# Patient Record
Sex: Male | Born: 1945 | Race: White | Hispanic: No | Marital: Married | Smoking: Never smoker
Health system: Southern US, Community
[De-identification: ages and names within clinical notes are randomized; demographics above are authoritative.]

## PROBLEM LIST (undated history)

## (undated) DIAGNOSIS — C4491 Basal cell carcinoma of skin, unspecified: Secondary | ICD-10-CM

## (undated) DIAGNOSIS — M1990 Unspecified osteoarthritis, unspecified site: Secondary | ICD-10-CM

## (undated) DIAGNOSIS — C4490 Unspecified malignant neoplasm of skin, unspecified: Secondary | ICD-10-CM

## (undated) DIAGNOSIS — M48062 Spinal stenosis, lumbar region with neurogenic claudication: Secondary | ICD-10-CM

## (undated) DIAGNOSIS — E785 Hyperlipidemia, unspecified: Secondary | ICD-10-CM

## (undated) DIAGNOSIS — I10 Essential (primary) hypertension: Secondary | ICD-10-CM

## (undated) HISTORY — PX: CERVICAL SPINE SURGERY: SHX589

## (undated) HISTORY — PX: ANKLE FRACTURE SURGERY: SHX122

## (undated) HISTORY — PX: EYE SURGERY: SHX253

---

## 2015-08-05 ENCOUNTER — Other Ambulatory Visit: Payer: Self-pay | Admitting: Family Medicine

## 2015-08-05 DIAGNOSIS — M5416 Radiculopathy, lumbar region: Secondary | ICD-10-CM

## 2015-08-16 ENCOUNTER — Ambulatory Visit
Admission: RE | Admit: 2015-08-16 | Discharge: 2015-08-16 | Disposition: A | Discharge status: Home / Self Care | Payer: Medicare Other | Source: Ambulatory Visit | Attending: Family Medicine | Admitting: Family Medicine

## 2015-08-16 DIAGNOSIS — M5416 Radiculopathy, lumbar region: Secondary | ICD-10-CM

## 2015-08-16 DIAGNOSIS — M4806 Spinal stenosis, lumbar region: Secondary | ICD-10-CM | POA: Diagnosis not present

## 2015-11-05 ENCOUNTER — Encounter: Payer: Self-pay | Admitting: *Deleted

## 2015-11-08 ENCOUNTER — Ambulatory Visit: Payer: Medicare Other | Admitting: Anesthesiology

## 2015-11-08 ENCOUNTER — Encounter
Admission: RE | Disposition: A | Discharge status: Home / Self Care | Payer: Self-pay | Source: Ambulatory Visit | Attending: Gastroenterology

## 2015-11-08 ENCOUNTER — Encounter: Payer: Self-pay | Admitting: *Deleted

## 2015-11-08 ENCOUNTER — Ambulatory Visit
Admission: RE | Admit: 2015-11-08 | Discharge: 2015-11-08 | Disposition: A | Discharge status: Home / Self Care | Payer: Medicare Other | Source: Ambulatory Visit | Attending: Gastroenterology | Admitting: Gastroenterology

## 2015-11-08 DIAGNOSIS — K52832 Lymphocytic colitis: Secondary | ICD-10-CM | POA: Diagnosis not present

## 2015-11-08 DIAGNOSIS — Z791 Long term (current) use of non-steroidal anti-inflammatories (NSAID): Secondary | ICD-10-CM | POA: Diagnosis not present

## 2015-11-08 DIAGNOSIS — Z85828 Personal history of other malignant neoplasm of skin: Secondary | ICD-10-CM | POA: Insufficient documentation

## 2015-11-08 DIAGNOSIS — E669 Obesity, unspecified: Secondary | ICD-10-CM | POA: Insufficient documentation

## 2015-11-08 DIAGNOSIS — K5730 Diverticulosis of large intestine without perforation or abscess without bleeding: Secondary | ICD-10-CM | POA: Diagnosis not present

## 2015-11-08 DIAGNOSIS — I10 Essential (primary) hypertension: Secondary | ICD-10-CM | POA: Insufficient documentation

## 2015-11-08 DIAGNOSIS — Z7982 Long term (current) use of aspirin: Secondary | ICD-10-CM | POA: Diagnosis not present

## 2015-11-08 DIAGNOSIS — K621 Rectal polyp: Secondary | ICD-10-CM | POA: Insufficient documentation

## 2015-11-08 DIAGNOSIS — Z6830 Body mass index (BMI) 30.0-30.9, adult: Secondary | ICD-10-CM | POA: Diagnosis not present

## 2015-11-08 DIAGNOSIS — R197 Diarrhea, unspecified: Secondary | ICD-10-CM | POA: Insufficient documentation

## 2015-11-08 DIAGNOSIS — E785 Hyperlipidemia, unspecified: Secondary | ICD-10-CM | POA: Diagnosis not present

## 2015-11-08 DIAGNOSIS — Z79899 Other long term (current) drug therapy: Secondary | ICD-10-CM | POA: Insufficient documentation

## 2015-11-08 HISTORY — PX: COLONOSCOPY WITH PROPOFOL: SHX5780

## 2015-11-08 HISTORY — DX: Hyperlipidemia, unspecified: E78.5

## 2015-11-08 HISTORY — DX: Essential (primary) hypertension: I10

## 2015-11-08 HISTORY — DX: Unspecified malignant neoplasm of skin, unspecified: C44.90

## 2015-11-08 HISTORY — DX: Basal cell carcinoma of skin, unspecified: C44.91

## 2015-11-08 SURGERY — COLONOSCOPY WITH PROPOFOL
Anesthesia: General

## 2015-11-08 MED ORDER — SODIUM CHLORIDE 0.9 % IV SOLN
INTRAVENOUS | Status: DC
  Administered 2015-11-08: 1000 mL via INTRAVENOUS

## 2015-11-08 MED ORDER — LIDOCAINE HCL (CARDIAC) 20 MG/ML IV SOLN
INTRAVENOUS | Status: DC | PRN
  Administered 2015-11-08: 80 mg via INTRAVENOUS

## 2015-11-08 MED ORDER — PROPOFOL 500 MG/50ML IV EMUL
INTRAVENOUS | Status: DC | PRN
  Administered 2015-11-08: 125 ug/kg/min via INTRAVENOUS

## 2015-11-08 MED ORDER — PROPOFOL 10 MG/ML IV BOLUS
INTRAVENOUS | Status: DC | PRN
  Administered 2015-11-08: 60 mg via INTRAVENOUS

## 2015-11-08 MED ORDER — SODIUM CHLORIDE 0.9 % IV SOLN: INTRAVENOUS | Status: DC

## 2015-11-08 NOTE — Transfer of Care (Signed)
Immediate Anesthesia Transfer of Care Note  Patient: Thomas Berger  Procedure(s) Performed: Procedure(s): COLONOSCOPY WITH PROPOFOL (N/A)  Patient Location: PACU  Anesthesia Type:General  Level of Consciousness: awake and responds to stimulation  Airway & Oxygen Therapy: Patient Spontanous Breathing and Patient connected to nasal cannula oxygen  Post-op Assessment: Report given to RN and Post -op Vital signs reviewed and stable  Post vital signs: Reviewed and stable  Last Vitals:  Vitals:   11/08/15 1216 11/08/15 1331  BP: (!) 142/89 115/74  Pulse: 67 (!) 58  Resp: 18 12  Temp: (!) 36.1 C     Last Pain:  Vitals:   11/08/15 1216  TempSrc: Tympanic         Complications: No apparent anesthesia complications

## 2015-11-08 NOTE — Anesthesia Preprocedure Evaluation (Signed)
Anesthesia Evaluation  Patient identified by MRN, date of birth, ID band Patient awake    Reviewed: Allergy & Precautions, NPO status , Patient's Chart, lab work & pertinent test results  History of Anesthesia Complications Negative for: history of anesthetic complications  Airway Mallampati: II  TM Distance: >3 FB Neck ROM: Full    Dental  (+) Implants   Pulmonary neg pulmonary ROS, neg sleep apnea, neg COPD,    breath sounds clear to auscultation- rhonchi (-) wheezing      Cardiovascular Exercise Tolerance: Good hypertension, Pt. on medications (-) CAD and (-) Past MI  Rhythm:Regular Rate:Normal - Systolic murmurs and - Diastolic murmurs    Neuro/Psych negative neurological ROS  negative psych ROS   GI/Hepatic negative GI ROS, Neg liver ROS,   Endo/Other  negative endocrine ROSneg diabetes  Renal/GU negative Renal ROS     Musculoskeletal negative musculoskeletal ROS (+)   Abdominal (+) + obese,   Peds  Hematology negative hematology ROS (+)   Anesthesia Other Findings Past Medical History: No date: Basal cell carcinoma     Comment: nose, temple, lt.arm No date: HTN (hypertension) No date: Hyperlipidemia No date: Skin cancer   Reproductive/Obstetrics                             Anesthesia Physical Anesthesia Plan  ASA: II  Anesthesia Plan: General   Post-op Pain Management:    Induction: Intravenous  Airway Management Planned: Natural Airway  Additional Equipment:   Intra-op Plan:   Post-operative Plan:   Informed Consent: I have reviewed the patients History and Physical, chart, labs and discussed the procedure including the risks, benefits and alternatives for the proposed anesthesia with the patient or authorized representative who has indicated his/her understanding and acceptance.   Dental advisory given  Plan Discussed with: CRNA and  Anesthesiologist  Anesthesia Plan Comments:         Anesthesia Quick Evaluation

## 2015-11-08 NOTE — Anesthesia Postprocedure Evaluation (Signed)
Anesthesia Post Note  Patient: Thomas Berger  Procedure(s) Performed: Procedure(s) (LRB): COLONOSCOPY WITH PROPOFOL (N/A)  Patient location during evaluation: Endoscopy Anesthesia Type: General Level of consciousness: awake and alert and oriented Pain management: pain level controlled Vital Signs Assessment: post-procedure vital signs reviewed and stable Respiratory status: spontaneous breathing, nonlabored ventilation and respiratory function stable Cardiovascular status: blood pressure returned to baseline and stable Postop Assessment: no signs of nausea or vomiting Anesthetic complications: no    Last Vitals:  Vitals:   11/08/15 1351 11/08/15 1401  BP: 124/72 131/82  Pulse: 60 65  Resp: 14 11  Temp:      Last Pain:  Vitals:   11/08/15 1331  TempSrc: Tympanic                 [name]

## 2015-11-08 NOTE — Op Note (Signed)
[facility] Gastroenterology Patient Name: Thomas Berger Procedure Date: 11/08/2015 12:57 PM MRN: [id number] Account #: [id number] Date of Birth: February 24, 1945 Admit Type: Outpatient Age: 70 Room: [facility] ENDO ROOM 1 Gender: Male Note Status: Finalized Procedure:            Colonoscopy Indications:          Clinically significant diarrhea of unexplained origin Providers:            Lollie Sails, MD Referring MD:         Dion Body (Referring MD) Medicines:            Monitored Anesthesia Care Complications:        No immediate complications. Procedure:            Pre-Anesthesia Assessment:                       - ASA Grade Assessment: II - A patient with mild                        systemic disease.                       After obtaining informed consent, the colonoscope was                        passed under direct vision. Throughout the procedure,                        the patient's blood pressure, pulse, and oxygen                        saturations were monitored continuously. The                        Colonoscope was introduced through the anus and                        advanced to the the cecum, identified by appendiceal                        orifice and ileocecal valve. The colonoscopy was                        performed without difficulty. The patient tolerated the                        procedure well. The quality of the bowel preparation                        was good. Findings:      A 1 mm polyp was found in the rectum. The polyp was sessile. The polyp       was removed with a cold biopsy forceps. Resection and retrieval were       complete.      Multiple small to medium diverticula were found in the sigmoid colon,       descending colon, transverse colon and ascending colon.      Biopsies for histology were taken with a cold forceps from the right       colon and left colon for evaluation of microscopic colitis.      The  retroflexed  view of the distal rectum and anal verge was normal and       showed no anal or rectal abnormalities.      The digital rectal exam was normal. Impression:           - One 1 mm polyp in the rectum, removed with a cold                        biopsy forceps. Resected and retrieved.                       - Diverticulosis in the sigmoid colon, in the                        descending colon, in the transverse colon and in the                        ascending colon.                       - The distal rectum and anal verge are normal on                        retroflexion view.                       - Biopsies were taken with a cold forceps from the                        right colon and left colon for evaluation of                        microscopic colitis. Recommendation:       - Discharge patient to home.                       - Await pathology results.                       - Check serum vasoactive intestinal polypeptide and                        chromogranin A today.                       - Return to GI clinic in 3 weeks. Procedure Code(s):    --- Professional ---                       [phone], Colonoscopy, flexible; with biopsy, single or                        multiple Diagnosis Code(s):    --- Professional ---                       K62.1, Rectal polyp                       R19.7, Diarrhea, unspecified                       K57.30, Diverticulosis of large intestine without  perforation or abscess without bleeding CPT copyright 2016 [employer]. All rights reserved. The codes documented in this report are preliminary and upon coder review may  be revised to meet current compliance requirements. Lollie Sails, MD 11/08/2015 1:31:15 PM This report has been signed electronically. Number of Addenda: 0 Note Initiated On: 11/08/2015 12:57 PM Scope Withdrawal Time: 0 hours 10 minutes 5 seconds  Total Procedure Duration: 0 hours 19 minutes 39 seconds        [facility]

## 2015-11-08 NOTE — H&P (Signed)
Outpatient short stay form Pre-procedure 11/08/2015 12:56 PM Lollie Sails MD  Primary Physician: Dr Mariana Arn  Reason for visit:  Colonoscopy  History of present illness:  Patient is a 70 year old male presenting today as above. He has a history of episode of diarrhea that lasted for at least a month or so of uncertain etiology. Stool studies were negative. This seems to improve much although he still has an episode of diarrhea prep was on. He tolerated his prep well, he does take 81 mg aspirin which he held today. He takes no other aspirin products or blood thinning agents. He does take naproxen fairly regularly.   Current Facility-Administered Medications:  .  0.9 %  sodium chloride infusion, , Intravenous, Continuous, Lollie Sails, MD, Last Rate: 20 mL/hr at 11/08/15 1245, 1,000 mL at 11/08/15 1245 .  0.9 %  sodium chloride infusion, , Intravenous, Continuous, Lollie Sails, MD  Prescriptions Prior to Admission  Medication Sig Dispense Refill Last Dose  . aspirin 81 MG chewable tablet Chew 81 mg by mouth daily.   11/07/2015 at Unknown time  . cholecalciferol (VITAMIN D) 400 units TABS tablet Take 2,000 Units by mouth.   11/07/2015 at Unknown time  . diphenhydrAMINE (BENADRYL) 25 MG tablet Take 25 mg by mouth AC breakfast.   11/07/2015 at Unknown time  . losartan (COZAAR) 100 MG tablet Take 100 mg by mouth daily.   11/08/2015 at 0600  . naproxen sodium (ANAPROX) 220 MG tablet Take 220 mg by mouth every morning.     . simvastatin (ZOCOR) 40 MG tablet Take 40 mg by mouth daily.   11/07/2015 at Unknown time     Allergies  Allergen Reactions  . Penicillins      Past Medical History:  Diagnosis Date  . Basal cell carcinoma    nose, temple, lt.arm  . HTN (hypertension)   . Hyperlipidemia   . Skin cancer     Review of systems:      Physical Exam    Heart and lungs: Regular rate and rhythm without rub or gallop, lungs are bilaterally clear.    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    Planned proceedures: Colonoscopy and indicated procedures. I have discussed the risks benefits and complications of procedures to include not limited to bleeding, infection, perforation and the risk of sedation and the patient wishes to proceed.    Lollie Sails, MD Gastroenterology 11/08/2015  12:56 PM

## 2015-11-09 ENCOUNTER — Encounter: Payer: Self-pay | Admitting: Gastroenterology

## 2015-11-10 LAB — SURGICAL PATHOLOGY

## 2015-11-10 LAB — CHROMOGRANIN A: Chromogranin A: <1 nmol/L (ref 0–5)

## 2015-11-12 LAB — VASOACTIVE INTESTINAL PEPTIDE (VIP): Vasoactive Intest Polypeptide: <16.8 pg/mL (ref 0.0–58.8)

## 2017-05-02 ENCOUNTER — Other Ambulatory Visit: Payer: Self-pay | Admitting: Neurosurgery

## 2017-05-19 NOTE — H&P (Signed)
Patient ID:   [id number] Patient: Thomas Berger  Date of Birth: 09-24-1945 Visit Type: Office Visit   Date: 05/02/2017 11:30 AM Provider: Marchia Meiers. Vertell Limber MD   This 72 year old male presents for MRI review.  HISTORY OF PRESENT ILLNESS:  1.  MRI review  Patient returns to review his MRI.  He notes increased pain recently with extension and standing long periods.  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 L2-3 L3-4 L4-5 spinal stenosis most severe at L3-4. L4-5 level stenosis has worsened.  Pain level is 4/10 on average 10/10 with extension.  Advised to have XLIF of L2-3 L3-4 L4-5 XLIF with percutaneous screws.  On examination today, the patient continues to demonstrate full strength in his lower extremities and he has relief of his pain with sitting.  However on standing and particularly with extension, the patient complains of severe and disabling and incapacitating pain in his back and into both of his legs.  This is limiting his ability to stand for any length of time and his ability to walk any distance at all.         Medical/Surgical/Interim History Reviewed, no change.  Last detailed document date:11/08/2016.     PAST MEDICAL HISTORY, SURGICAL HISTORY, FAMILY HISTORY, SOCIAL HISTORY AND REVIEW OF SYSTEMS I have reviewed the patient's past medical, surgical, family and social history as well as the comprehensive review of systems as included on the [facility] history form dated 12/11/2016, which I have signed.  Family History:  Reviewed, no changes.  Last detailed document date:11/08/2016.   Social History: Reviewed, no changes. Last detailed document date: 11/08/2016.    MEDICATIONS: (added, continued or stopped this visit) Started Medication Directions Instruction Stopped   aspirin 81 mg chewable tablet chew 1 tablet by oral route  every day     Benadryl 25 mg capsule take 2 capsule  by oral route  every 4 - 6 hours as needed     losartan 100 mg tablet take 1 tablet by oral route  every day     melatonin 10 mg tablet Take as directed     omega-3 28.5 mg-dha 23.75 mg-epa 4.75 mg-fish oil 113.5 mg chew tablet      simvastatin 40 mg tablet take 1 tablet by oral route  every day in the evening     Vitamin D3 2,000 unit capsule Take daily as directed       ALLERGIES: Ingredient Reaction Medication Name Comment  PENICILLINS Hives     Reviewed, no changes.    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 L2-3 L3-4 L4-5 spinal stenosis most severe at L3-4. L4-5 level stenosis has worsened.  Pain level is 4/10 on average 10/10 with extension.  [name] pain is positional.  This is related to his mobile spondylolisthesis.  He has severe pain with standing and with extension and this is limiting his ability to walk any distance at all.  When he is seated he has normal strength in his legs.  He denies weakness.  He has weakness with standing and worsened with extension of his lumbar spine.  This is consistent with his symptomatic neurogenic claudication related to mobile spondylolisthesis with spinal stenosis affecting the  L2-3, L3-4, L4-5 levels.  PLAN:  Scheduled XLIF of L2-3 L3-4 L4-5 XLIF with percutaneous screws. Nurse education given.  Fitted for LSO brace  Orders: Diagnostic Procedures: Assessment Procedure  M54.16 Lumbar Spine- AP/Lat  Instruction(s)/Education: Assessment Instruction   Tobacco cessation counseling  I10 Hypertension education  Z68.31 Dietary management education, guidance, and counseling  Miscellaneous: Assessment   M48.062 Aspen Lo Sag Rigid Panel Quick   Completed Orders (this encounter) Order Details Reason Side Interpretation Result Initial  Treatment Date Region  Tobacco cessation counseling         Hypertension education Patient to follow up with primary care provider.        Dietary management education, guidance, and counseling patient encouraged to eat a well balanced diet         Assessment/Plan   # Detail Type Description   1. Assessment Spondylolisthesis, lumbar region (M43.16).       2. 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   Plan Orders Aspen Lo Sag Rigid Panel Quick.       3. Assessment Lumbar radiculopathy (M54.16).       4. 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       5. Assessment Essential (primary) hypertension (I10).       6. Assessment Body mass index (BMI) 31.0-31.9, adult (Z68.31).   Plan Orders Today's instructions / counseling include(s) Dietary management education, guidance, and counseling.         Pain Management Plan Pain Scale: 2/10. Method: Numeric Pain Intensity Scale. Location: back. Onset: 08/30/2013. Duration: varies. Quality: discomforting. Pain management follow-up plan of care: Patient is taking OTC pain relievers for relief..              Provider:  Vertell Limber MD, Marchia Meiers 05/16/2017 11:29 AM  Dictation edited by: Marchia Meiers. Vertell Limber    CC Providers: [facility] [address],  [place]  [address]-   Kanhka Linthavong  [facility] Internal Medicine Dept [place],  [address]-              Electronically signed by Marchia Meiers. Vertell Limber MD on 05/16/2017 11:29 AM  Patient: Thomas Berger  Date of Birth: 03-17-1945 Visit Type: Office Visit   Date: 03/26/2017 03:15 PM Provider: Marchia Meiers. Vertell Limber MD   This 72 year old male presents for numbness.   History of Present Illness: 1.  numbness  Patient returns noting increased numbness in both legs since walking his dog more frequently.  He notes standing long periods results in  difficulty walking.  Patient describes that his pain is not severe and ranges from 1 to 3/10 but that his numbness is much worse.  He feels that he is stumbling although not falling and he is having a lot more difficulty with leg numbness.  He gets numb in both of his legs when he is up for more than 3-4 minutes at a time any is only able to walk about 52 yd at a time before he has to sit down.   His history is consistent with worsening neurogenic claudication.  The I am concerned that he is developing more severe spinal stenosis.          PAST MEDICAL HISTORY, SURGICAL HISTORY, FAMILY HISTORY, SOCIAL HISTORY AND REVIEW OF SYSTEMS I have reviewed the patient's past medical, surgical, family and social history as well as the comprehensive review of systems as included on the [facility] history form dated 12/11/2016, which I have signed.   MEDICATIONS(added, continued or stopped this visit): Started Medication Directions Instruction Stopped   aspirin 81 mg chewable tablet chew 1 tablet by oral route  every day     Benadryl 25 mg capsule take 2 capsule by oral route  every 4 - 6 hours as needed     losartan 100 mg tablet take 1 tablet by oral route  every day     melatonin 10 mg tablet Take as directed     omega-3 28.5 mg-dha 23.75 mg-epa 4.75 mg-fish oil 113.5 mg chew tablet      simvastatin 40 mg tablet take 1 tablet by oral route  every day in the evening     Vitamin D3 2,000 unit capsule Take daily as directed       ALLERGIES: Ingredient Reaction Medication Name Comment  PENICILLINS Hives     Reviewed, no changes.    Vitals Date Temp F BP Pulse Ht In Wt Lb BMI BSA Pain Score  03/26/2017  171/91 77 68 206.6 31.41  3/10      IMPRESSION The patient appears to be developing worsening lumbar spinal stenosis.  I have recommended obtaining lumbar radiographs with flexion-extension views to assess for mobile spondylolisthesis and also a new MRI of  the lumbar spine.   Completed Orders (this encounter) Order Details Reason Side Interpretation Result Initial Treatment Date Region  Dietary management education, guidance, and counseling Encouraged to eat well balanced diet and follow up with primary care physician        Hypertension education Follow up with primary care physician for elevated Blood pressure        Lumbar Spine- AP/Lat/Flex/Ex      03/26/2017    Assessment/Plan # Detail Type Description   1. Assessment Lumbar radiculopathy (M54.16).       2. Assessment Low back pain, unspecified back pain laterality, with sciatica presence unspecified (M54.5).       3. Assessment Spondylolisthesis, lumbar region (M43.16).       4. 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       5. 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       6. Assessment Body mass index (BMI) 31.0-31.9, adult (Z68.31).   Plan Orders Today's instructions / counseling include(s) Dietary management education, guidance, and counseling.       7. Assessment Essential (primary) hypertension (I10).           Pain Management Plan Pain Scale: 3/10. Method: Numeric Pain Intensity Scale. Location: leg. Onset: 08/30/2013. Duration: varies. Quality: discomforting. Pain management follow-up plan of care: Patient alternating heat and ice.  Patient will return to see me after lumbar imaging has been performed.   Orders: Diagnostic Procedures: Assessment Procedure  M41.26 Lumbar Spine- AP/Lat/Flex/Ex  M43.16 Lumbar Spine- AP/Lat/Flex/Ex  M43.16 MRI Spine/lumb W/o Contrast  Instruction(s)/Education: Assessment Instruction  I10 Hypertension education  Z68.31 Dietary management education, guidance, and counseling             Provider:  Vertell Limber MDMarchia Meiers 04/01/2017 5:42 PM  Dictation edited by: Marchia Meiers. Vertell Limber    CC Providers: [facility] [address]  [place],  [place]  [address]-   Kanhka Linthavong  [facility] Internal Medicine Dept [place], [place] [address]-              Electronically signed by Marchia Meiers. Vertell Limber MD on  04/01/2017 06:24 PM  Patient ID:   [id number] Patient: Thomas Berger  Date of Birth: 1945/04/25 Visit Type: Office Visit   Date: 02/07/2017 11:45 AM Provider: Marchia Meiers. Vertell Limber MD   This 71 year old male presents for back pain.   History of Present Illness: 1.  back pain  Patient reports almost complete relief and the majority of his leg numbness resolved following his L3-4 ESI. He is concerned the leg numbness began to return several days ago. He reports altering his diet to help facilitate weight loss. He is also increasing his walking.           MEDICATIONS(added, continued or stopped this visit): Started Medication Directions Instruction Stopped   Aleve 220 mg tablet take 1 tablet by oral route  every 12 hours as needed  02/07/2017   aspirin 81 mg chewable tablet chew 1 tablet by oral route  every day     Benadryl 25 mg capsule take 2 capsule by oral route  every 4 - 6 hours as needed     losartan 100 mg tablet take 1 tablet by oral route  every day     melatonin 10 mg tablet Take as directed     omega-3 28.5 mg-dha 23.75 mg-epa 4.75 mg-fish oil 113.5 mg chew tablet      simvastatin 40 mg tablet take 1 tablet by oral route  every day in the evening     Vitamin D3 2,000 unit capsule Take daily as directed       ALLERGIES: Ingredient Reaction Medication Name Comment  PENICILLINS Hives        Vitals Date Temp F BP Pulse Ht In Wt Lb BMI BSA Pain Score  02/07/2017  159/93 65 68 206.6 31.41  1/10      IMPRESSION Patient returns as scheduled and pleased with the relief in bilateral leg numbness following L3-4 ESI. MRI shows severe listhesis of L3 on L4, and listhesis of L4 on L5 worse on the left. Advised patient to continue to follow-up with Dr. Maryjean Ka for  repeat injections. Patient will call if pain is no longer resolved with injections.      Pain Management Plan Pain Scale: 1/10. Method: Numeric Pain Intensity Scale. Location: back. Onset: 08/30/2013.  Follow-up prn.              Provider:  Vertell Limber MD, Marchia Meiers 02/07/2017 12:44 PM  Dictation edited by: Lucita Lora    CC Providers: [facility] [address] [place],  [place]  [address]-   Kanhka Linthavong  [facility] Internal Medicine Dept [place], [place] [address]-              Electronically signed by Marchia Meiers. Vertell Limber MD on 02/09/2017 04:47 PM  Patient ID:   [id number] Patient: Thomas Berger  Date of Birth: 05-02-1945 Visit Type: Office Visit   Date: 09/27/2015 09:15 AM Provider: Marchia Meiers. Vertell Limber MD   This 72 year old male presents for numbness.  History of Present Illness: 1.  numbness    Thomas Berger, 72 y.o. retired male visits for evaluation of BLE pain & numbness. Symptoms increasing in frequency x22yr include lumbar soreness followed by BLE numbness/weakness after walking on concrete or push-mowing.    Hx: Basal cellx3; HTN SxHx: '94right ankle; ACDF 2level 2010; basal cell 2013  Advil PM nightly  (off daytime NSAIDS d/t GI issues)  X-rays on [facility] reveal multilevel degenerative changes and facet arthritis, mild  levoconvex scoliosis, and L3-4 anterolisthesis of 7 mm in neutral, 8 mm in flexion, and 8 mm in extension.   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 Spondylolisthesis at L3-4.        PAST MEDICAL/SURGICAL HISTORY   (Detailed)  Disease/disorder Onset Date Management Date Comments    ankle surgery      Surgery, cervical spine      Basal cell (nose and cheek)    Cancer, basal Cell      High cholesterol      Hypertension         PAST MEDICAL HISTORY, SURGICAL HISTORY, FAMILY HISTORY,  SOCIAL HISTORY AND REVIEW OF SYSTEMS  09/27/2015, which I have signed.  Family History  (Detailed) Relationship Family Member Name Deceased Age at Death Condition Onset Age Cause of Death  Father    Alzheimer's disease  N  Father    Arthritis  N  Mother    Arthritis  N  Mother    Hypertension  N    SOCIAL HISTORY  (Detailed) Tobacco use reviewed. Preferred language is Unknown.   Smoking status: Never smoker.  SMOKING STATUS Use Status Type Smoking Status Usage Per Day Years Used Total Pack Years  no/never  Never smoker       HOME ENVIRONMENT/SAFETY The patient has not fallen in the last year.        MEDICATIONS(added, continued or stopped this visit): Started Medication Directions Instruction Stopped   Aleve 220 mg tablet take 1 tablet by oral route  every 12 hours as needed     aspirin 81 mg chewable tablet chew 1 tablet by oral route  every day     Benadryl 25 mg capsule take 2 capsule by oral route  every 4 - 6 hours as needed     losartan 100 mg tablet take 1 tablet by oral route  every day     melatonin 10 mg tablet Take as directed     simvastatin 40 mg tablet take 1 tablet by oral route  every day in the evening     Vitamin D3 2,000 unit capsule Take daily as directed       ALLERGIES: Ingredient Reaction Medication Name Comment  PENICILLINS Hives     Reviewed, updated.    Vitals Date Temp F BP Pulse Ht In Wt Lb BMI BSA Pain Score  09/27/2015  162/88 69 68 206 31.32  4/10     PHYSICAL EXAM General Level of Distress: no acute distress Overall Appearance: normal    Cardiovascular Cardiac: regular rate and rhythm without murmur  Respiratory Lungs: clear to auscultation  Neurological Recent and Remote Memory: normal Attention Span and Concentration:   normal Language: normal Fund of Knowledge: normal  Right Left Sensation: normal normal Upper Extremity Coordination: normal normal  Lower Extremity  Coordination: normal normal  Musculoskeletal Gait and Station: normal  Right Left Upper Extremity Muscle Strength: normal normal Lower Extremity Muscle Strength: normal normal Upper Extremity Muscle Tone:  normal normal Lower Extremity Muscle Tone: normal normal  Motor Strength Upper and lower extremity motor strength was tested in the clinically pertinent muscles.     Deep Tendon Reflexes  Right Left Biceps: normal normal Triceps: normal normal Brachiloradialis: normal normal Patellar: decrease normal Achilles: normal normal  Sensory Sensation was tested at L1 to S1.   Cranial Nerves II. Optic Nerve/Visual Fields: normal III. Oculomotor: normal IV. Trochlear: normal V. Trigeminal: normal VI.  Abducens: normal VII. Facial: normal VIII. Acoustic/Vestibular: normal IX. Glossopharyngeal: normal X. Vagus: normal XI. Spinal Accessory: normal XII. Hypoglossal: normal  Motor and other Tests Lhermittes: negative Rhomberg: negative    Right Left Hoffman's: normal normal Clonus: normal normal Babinski: normal normal SLR: negative negative Patrick's Corky Sox): negative negative Toe Walk: normal normal Toe Lift: normal normal Heel Walk: normal normal SI Joint: nontender nontender   Additional Findings:  Toe touch to the floor, UE and LE strength is normal, symmetric reflexes   DIAGNOSTIC RESULTS X-rays on [facility] reveal multilevel degenerative changes and facet arthritis, mild levoconvex scoliosis, and L3-4 anterolisthesis of 7 mm in neutral, 8 mm in flexion, and 8 mm in extension.   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 Spondylolisthesis at L3-4.    IMPRESSION The patient is experiencing low back and left>right leg pain/numbness. On review of his imaging, he has multilevel degenerative changes, facet arthritis, and spinal stenosis, which is severe at L3-4 and moderate to severe at L2-3 and L  45. He also has anterolisthesis at L3-4. On confrontational testing, he has negative SLR bilaterally, strength is normal, and a decreased patellar reflex on the right. Since the patient's pain level is not severe, I do not recommend surgical intervention at this time although his imaging is quite significant. I advise that he get into some PT and possible injections for conservative treatment of his low back and bilateral leg symptoms.  Completed Orders (this encounter) Order Details Reason Side Interpretation Result Initial Treatment Date Region  Dietary management education, guidance, and counseling Encouraged to eat a well balanced diet and follow up with primary care physician.        Hypertension education Follow up with primary care physician for elevated blood pressure.        Lumbar Spine- AP/Lat/Obls/Spot/Flex/Ex      09/27/2015 All Levels to All Levels   Assessment/Plan # Detail Type Description   1. Assessment Body mass index (BMI) 31.0-31.9, adult (Z68.31).   Plan Orders Today's instructions / counseling include(s) Dietary management education, guidance, and counseling.       2. Assessment Essential (primary) hypertension (I10).       3. Assessment Lumbar radiculopathy (M54.16).         Pain Assessment/Treatment Pain Scale: 4/10. Method: Numeric Pain Intensity Scale. Location: leg. Onset: 08/31/2014. Duration: varies. Quality: discomforting. Pain Assessment/Treatment follow-up plan of care: Patient is taking over the counter pain relievers for relief..  Fall Risk Plan The patient has not fallen in the last year.  Ordered PT. Follow up in 2 months.   Orders: Diagnostic Procedures: Assessment Procedure  M54.16 Lumbar Spine- AP/Lat/Obls/Spot/Flex/Ex  Instruction(s)/Education: Assessment Instruction  I10 Hypertension education  Z68.31 Dietary management education, guidance, and counseling             Provider:  Marchia Meiers. Vertell Limber MD  09/27/2015 09:52 AM Dictation  edited by: Johnella Moloney    CC Providers: [facility] [address] [place] [address] [place],  [place]  [address]-   Kanhka Linthavong [facility] Internal Medicine Dept [place], [place] [address]-              Electronically signed by Marchia Meiers. Vertell Limber MD on 09/27/2015 10:45 AM

## 2017-05-29 NOTE — Pre-Procedure Instructions (Signed)
Thomas Berger  05/29/2017      [facility] [facility] [address] [place] [address] Phone: [phone] Fax: [phone]    Your procedure is scheduled on Fri., Jun 08, 2017  Report to [facility] Admitting Entrance "A" at 10:00AM  Call this number if you have problems the morning of surgery:  [phone]   Remember:  Do not eat food or drink liquids after midnight.  Take these medicines the morning of surgery with A SIP OF WATER: If needed Acetaminophen (TYLENOL)for pain and SALINE Nasal Rinse for congestion.  Follow your doctor's instruction regarding Aspirin  7 days before surgery (5/3), stop taking all Aspirins, Vitamins, Fish oils, and Herbal medications. Also stop all NSAIDS i.e. Advil, Ibuprofen, Motrin, Aleve, Anaprox, Naproxen, BC and Goody Powders. Including: BETA CAROTENE and Melatonin   Do not wear jewelry.  Do not wear lotions, powders, colognes, or deodorant.  Do not shave 48 hours prior to surgery.  Men may shave face.  Do not bring valuables to the hospital.  [facility] is not responsible for any belongings or valuables.  Contacts, dentures or bridgework may not be worn into surgery.  Leave your suitcase in the car.  After surgery it may be brought to your room.  For patients admitted to the hospital, discharge time will be determined by your treatment team.  Patients discharged the day of surgery will not be allowed to drive home.   Special instructions:   [facility]- Preparing For Surgery  Before surgery, you can play an important role. Because skin is not sterile, your skin needs to be as free of germs as possible. You can reduce the number of germs on your skin by washing with CHG (chlorahexidine gluconate) Soap before surgery.  CHG is an antiseptic cleaner which kills germs and bonds with the skin to continue killing germs even after washing.  Please do not  use if you have an allergy to CHG or antibacterial soaps. If your skin becomes reddened/irritated stop using the CHG.  Do not shave (including legs and underarms) for at least 48 hours prior to first CHG shower. It is OK to shave your face.  Please follow these instructions carefully.   1. Shower the NIGHT BEFORE SURGERY and the MORNING OF SURGERY with CHG.   2. If you chose to wash your hair, wash your hair first as usual with your normal shampoo.  3. After you shampoo, rinse your hair and body thoroughly to remove the shampoo.  4. Use CHG as you would any other liquid soap. You can apply CHG directly to the skin and wash gently with a scrungie or a clean washcloth.   5. Apply the CHG Soap to your body ONLY FROM THE NECK DOWN.  Do not use on open wounds or open sores. Avoid contact with your eyes, ears, mouth and genitals (private parts). Wash Face and genitals (private parts)  with your normal soap.  6. Wash thoroughly, paying special attention to the area where your surgery will be performed.  7. Thoroughly rinse your body with warm water from the neck down.  8. DO NOT shower/wash with your normal soap after using and rinsing off the CHG Soap.  9. Pat yourself dry with a CLEAN TOWEL.  10. Wear CLEAN PAJAMAS to bed the night before surgery, wear comfortable clothes the morning of surgery  11.  Place CLEAN SHEETS on your bed the night of your first shower and DO NOT SLEEP WITH PETS.  Day of Surgery: Do not apply any deodorants/lotions. Please wear clean clothes to the hospital/surgery center.    Please read over the following fact sheets that you were given. Pain Booklet, Coughing and Deep Breathing, MRSA Information and Surgical Site Infection Prevention

## 2017-05-30 ENCOUNTER — Encounter (HOSPITAL_COMMUNITY)
Admission: RE | Admit: 2017-05-30 | Discharge: 2017-05-30 | Disposition: A | Discharge status: Home / Self Care | Payer: Medicare Other | Source: Ambulatory Visit | Attending: Neurosurgery | Admitting: Neurosurgery

## 2017-05-30 ENCOUNTER — Encounter (HOSPITAL_COMMUNITY): Payer: Self-pay

## 2017-05-30 ENCOUNTER — Other Ambulatory Visit: Payer: Self-pay

## 2017-05-30 DIAGNOSIS — R001 Bradycardia, unspecified: Secondary | ICD-10-CM | POA: Diagnosis not present

## 2017-05-30 DIAGNOSIS — Z01810 Encounter for preprocedural cardiovascular examination: Secondary | ICD-10-CM | POA: Diagnosis present

## 2017-05-30 DIAGNOSIS — Z01812 Encounter for preprocedural laboratory examination: Secondary | ICD-10-CM | POA: Insufficient documentation

## 2017-05-30 HISTORY — DX: Spinal stenosis, lumbar region with neurogenic claudication: M48.062

## 2017-05-30 HISTORY — DX: Unspecified osteoarthritis, unspecified site: M19.90

## 2017-05-30 LAB — BASIC METABOLIC PANEL
Anion gap: 7 (ref 5–15)
BUN: 24 mg/dL — AB (ref 6–20)
CALCIUM: 9.4 mg/dL (ref 8.9–10.3)
CO2: 27 mmol/L (ref 22–32)
CREATININE: 1.17 mg/dL (ref 0.61–1.24)
Chloride: 105 mmol/L (ref 101–111)
GFR calc Af Amer: >60 mL/min (ref >60)
GLUCOSE: 109 mg/dL — AB (ref 65–99)
Potassium: 4.3 mmol/L (ref 3.5–5.1)
Sodium: 139 mmol/L (ref 135–145)

## 2017-05-30 LAB — ABO/RH: ABO/RH(D): A POS

## 2017-05-30 LAB — CBC
HCT: 40.9 % (ref 39.0–52.0)
Hemoglobin: 13.9 g/dL (ref 13.0–17.0)
MCH: 30.3 pg (ref 26.0–34.0)
MCHC: 34 g/dL (ref 30.0–36.0)
MCV: 89.3 fL (ref 78.0–100.0)
PLATELETS: 208 10*3/uL (ref 150–400)
RBC: 4.58 MIL/uL (ref 4.22–5.81)
RDW: 12 % (ref 11.5–15.5)
WBC: 5.1 10*3/uL (ref 4.0–10.5)

## 2017-05-30 LAB — TYPE AND SCREEN
ABO/RH(D): A POS
ANTIBODY SCREEN: NEGATIVE

## 2017-05-30 LAB — SURGICAL PCR SCREEN
MRSA, PCR: NEGATIVE
STAPHYLOCOCCUS AUREUS: NEGATIVE

## 2017-05-30 NOTE — Progress Notes (Signed)
[facility]  Cardiologist - Denies  Chest x-ray - Denies  EKG - 05/30/17  Stress Test - Denies  ECHO - Denies  Cardiac Cath - Denies  Sleep Study - Denies CPAP - None  LABS- 05/30/17: CBC, BMP, T/S  ASA- LD- 5/3  Anesthesia- No  Pt denies having chest pain, sob, or fever at this time. All instructions explained to the pt, with a verbal understanding of the material. Pt agrees to go over the instructions while at home for a better understanding. The opportunity to ask questions was provided.

## 2017-06-08 ENCOUNTER — Encounter (HOSPITAL_COMMUNITY): Payer: Self-pay | Admitting: *Deleted

## 2017-06-08 ENCOUNTER — Inpatient Hospital Stay (HOSPITAL_COMMUNITY): Payer: Medicare Other | Admitting: Certified Registered"

## 2017-06-08 ENCOUNTER — Encounter (HOSPITAL_COMMUNITY)
Admission: RE | Disposition: A | Discharge status: Home / Self Care | Payer: Self-pay | Source: Ambulatory Visit | Attending: Neurosurgery

## 2017-06-08 ENCOUNTER — Inpatient Hospital Stay (HOSPITAL_COMMUNITY): Payer: Medicare Other

## 2017-06-08 ENCOUNTER — Inpatient Hospital Stay (HOSPITAL_COMMUNITY)
Admission: RE | Admit: 2017-06-08 | Discharge: 2017-06-11 | DRG: 460 | Disposition: A | Discharge status: Home / Self Care | Payer: Medicare Other | Source: Ambulatory Visit | Attending: Neurosurgery | Admitting: Neurosurgery

## 2017-06-08 ENCOUNTER — Other Ambulatory Visit: Payer: Self-pay

## 2017-06-08 DIAGNOSIS — M4306 Spondylolysis, lumbar region: Secondary | ICD-10-CM

## 2017-06-08 DIAGNOSIS — M4696 Unspecified inflammatory spondylopathy, lumbar region: Secondary | ICD-10-CM | POA: Diagnosis present

## 2017-06-08 DIAGNOSIS — Z8261 Family history of arthritis: Secondary | ICD-10-CM

## 2017-06-08 DIAGNOSIS — I10 Essential (primary) hypertension: Secondary | ICD-10-CM | POA: Diagnosis present

## 2017-06-08 DIAGNOSIS — M5416 Radiculopathy, lumbar region: Secondary | ICD-10-CM | POA: Diagnosis present

## 2017-06-08 DIAGNOSIS — Z8249 Family history of ischemic heart disease and other diseases of the circulatory system: Secondary | ICD-10-CM | POA: Diagnosis not present

## 2017-06-08 DIAGNOSIS — M4126 Other idiopathic scoliosis, lumbar region: Secondary | ICD-10-CM | POA: Diagnosis present

## 2017-06-08 DIAGNOSIS — Z7982 Long term (current) use of aspirin: Secondary | ICD-10-CM

## 2017-06-08 DIAGNOSIS — M48062 Spinal stenosis, lumbar region with neurogenic claudication: Principal | ICD-10-CM | POA: Diagnosis present

## 2017-06-08 DIAGNOSIS — Z880 Allergy status to penicillin: Secondary | ICD-10-CM

## 2017-06-08 DIAGNOSIS — R110 Nausea: Secondary | ICD-10-CM | POA: Diagnosis not present

## 2017-06-08 DIAGNOSIS — E7800 Pure hypercholesterolemia, unspecified: Secondary | ICD-10-CM | POA: Diagnosis present

## 2017-06-08 DIAGNOSIS — M4316 Spondylolisthesis, lumbar region: Secondary | ICD-10-CM | POA: Diagnosis present

## 2017-06-08 HISTORY — PX: ANTERIOR LAT LUMBAR FUSION: SHX1168

## 2017-06-08 HISTORY — PX: LUMBAR PERCUTANEOUS PEDICLE SCREW 3 LEVEL: SHX5562

## 2017-06-08 SURGERY — ANTERIOR LATERAL LUMBAR FUSION 3 LEVELS
Anesthesia: General | Laterality: Left

## 2017-06-08 MED ORDER — SODIUM CHLORIDE 0.9% FLUSH
3.0000 mL | Freq: Two times a day (BID) | INTRAVENOUS | Status: DC
  Administered 2017-06-09 – 2017-06-10 (×2): 3 mL via INTRAVENOUS

## 2017-06-08 MED ORDER — LIDOCAINE-EPINEPHRINE 1 %-1:100000 IJ SOLN
INTRAMUSCULAR | Status: AC
Start: 2017-06-08 — End: ?
  Filled 2017-06-08: qty 1

## 2017-06-08 MED ORDER — BUPIVACAINE HCL (PF) 0.5 % IJ SOLN
INTRAMUSCULAR | Status: AC
  Filled 2017-06-08: qty 30

## 2017-06-08 MED ORDER — LIDOCAINE-EPINEPHRINE 1 %-1:100000 IJ SOLN
INTRAMUSCULAR | Status: AC
  Filled 2017-06-08: qty 1

## 2017-06-08 MED ORDER — PHENYLEPHRINE 40 MCG/ML (10ML) SYRINGE FOR IV PUSH (FOR BLOOD PRESSURE SUPPORT)
PREFILLED_SYRINGE | INTRAVENOUS | Status: AC
  Filled 2017-06-08: qty 20

## 2017-06-08 MED ORDER — MENTHOL 3 MG MT LOZG
1.0000 | LOZENGE | OROMUCOSAL | Status: DC | PRN
  Administered 2017-06-09: 3 mg via ORAL
  Filled 2017-06-08: qty 9

## 2017-06-08 MED ORDER — HYDROMORPHONE HCL 2 MG/ML IJ SOLN
0.2500 mg | INTRAMUSCULAR | Status: DC | PRN
  Administered 2017-06-08 (×4): 0.5 mg via INTRAVENOUS

## 2017-06-08 MED ORDER — ALUM & MAG HYDROXIDE-SIMETH 200-200-20 MG/5ML PO SUSP
30.0000 mL | Freq: Four times a day (QID) | ORAL | Status: DC | PRN
  Administered 2017-06-09 – 2017-06-10 (×2): 30 mL via ORAL
  Filled 2017-06-08 (×2): qty 30

## 2017-06-08 MED ORDER — LACTATED RINGERS IV SOLN
INTRAVENOUS | Status: DC
  Administered 2017-06-08 (×3): via INTRAVENOUS

## 2017-06-08 MED ORDER — GLYCOPYRROLATE 0.2 MG/ML IJ SOLN
INTRAMUSCULAR | Status: DC | PRN
  Administered 2017-06-08: 0.3 mg via INTRAVENOUS

## 2017-06-08 MED ORDER — METHOCARBAMOL 500 MG PO TABS
ORAL_TABLET | ORAL | Status: AC
  Administered 2017-06-09: 500 mg via ORAL
  Filled 2017-06-08: qty 1

## 2017-06-08 MED ORDER — FENTANYL CITRATE (PF) 100 MCG/2ML IJ SOLN
INTRAMUSCULAR | Status: DC | PRN
  Administered 2017-06-08 (×3): 50 ug via INTRAVENOUS
  Administered 2017-06-08: 100 ug via INTRAVENOUS
  Administered 2017-06-08: 50 ug via INTRAVENOUS
  Administered 2017-06-08: 100 ug via INTRAVENOUS
  Administered 2017-06-08: 50 ug via INTRAVENOUS

## 2017-06-08 MED ORDER — EPHEDRINE SULFATE 50 MG/ML IJ SOLN
INTRAMUSCULAR | Status: DC | PRN
  Administered 2017-06-08: 10 mg via INTRAVENOUS

## 2017-06-08 MED ORDER — METHOCARBAMOL 1000 MG/10ML IJ SOLN
500.0000 mg | Freq: Four times a day (QID) | INTRAVENOUS | Status: DC | PRN
  Filled 2017-06-08: qty 5

## 2017-06-08 MED ORDER — MELATONIN 3 MG PO TABS
3.0000 mg | ORAL_TABLET | Freq: Every day | ORAL | Status: DC
  Administered 2017-06-08 – 2017-06-10 (×3): 3 mg via ORAL
  Filled 2017-06-08 (×3): qty 1

## 2017-06-08 MED ORDER — VANCOMYCIN HCL IN DEXTROSE 1-5 GM/200ML-% IV SOLN
1000.0000 mg | Freq: Once | INTRAVENOUS | Status: AC
  Administered 2017-06-08: 1000 mg via INTRAVENOUS
  Filled 2017-06-08: qty 200

## 2017-06-08 MED ORDER — SIMVASTATIN 20 MG PO TABS
20.0000 mg | ORAL_TABLET | Freq: Every day | ORAL | Status: DC
  Administered 2017-06-08 – 2017-06-10 (×3): 20 mg via ORAL
  Filled 2017-06-08 (×3): qty 1

## 2017-06-08 MED ORDER — HYDROCODONE-ACETAMINOPHEN 5-325 MG PO TABS
1.0000 | ORAL_TABLET | ORAL | Status: DC | PRN
  Administered 2017-06-09 (×2): 1 via ORAL
  Filled 2017-06-08 (×2): qty 1

## 2017-06-08 MED ORDER — METHOCARBAMOL 500 MG PO TABS
500.0000 mg | ORAL_TABLET | Freq: Four times a day (QID) | ORAL | Status: DC | PRN
  Administered 2017-06-08 – 2017-06-11 (×7): 500 mg via ORAL
  Filled 2017-06-08 (×7): qty 1

## 2017-06-08 MED ORDER — SUGAMMADEX SODIUM 200 MG/2ML IV SOLN
INTRAVENOUS | Status: AC
  Filled 2017-06-08: qty 2

## 2017-06-08 MED ORDER — OXYCODONE HCL 5 MG PO TABS
ORAL_TABLET | ORAL | Status: AC
  Administered 2017-06-08: 10 mg via ORAL
  Filled 2017-06-08: qty 2

## 2017-06-08 MED ORDER — ACETAMINOPHEN 325 MG PO TABS
650.0000 mg | ORAL_TABLET | ORAL | Status: DC | PRN
  Administered 2017-06-09 – 2017-06-10 (×2): 650 mg via ORAL
  Filled 2017-06-08 (×2): qty 2

## 2017-06-08 MED ORDER — SODIUM CHLORIDE 0.9% FLUSH: 3.0000 mL | INTRAVENOUS | Status: DC | PRN

## 2017-06-08 MED ORDER — PROPOFOL 10 MG/ML IV BOLUS
INTRAVENOUS | Status: DC | PRN
  Administered 2017-06-08: 150 mg via INTRAVENOUS

## 2017-06-08 MED ORDER — ZOLPIDEM TARTRATE 5 MG PO TABS: 5.0000 mg | ORAL_TABLET | Freq: Every evening | ORAL | Status: DC | PRN

## 2017-06-08 MED ORDER — VANCOMYCIN HCL IN DEXTROSE 1-5 GM/200ML-% IV SOLN
INTRAVENOUS | Status: AC
  Administered 2017-06-08: 1000 mg via INTRAVENOUS
  Filled 2017-06-08: qty 200

## 2017-06-08 MED ORDER — LIDOCAINE HCL (CARDIAC) PF 100 MG/5ML IV SOSY
PREFILLED_SYRINGE | INTRAVENOUS | Status: DC | PRN
  Administered 2017-06-08: 100 mg via INTRAVENOUS

## 2017-06-08 MED ORDER — LOSARTAN POTASSIUM 50 MG PO TABS
100.0000 mg | ORAL_TABLET | Freq: Every day | ORAL | Status: DC
  Administered 2017-06-08 – 2017-06-11 (×4): 100 mg via ORAL
  Filled 2017-06-08 (×4): qty 2

## 2017-06-08 MED ORDER — OXYCODONE HCL 5 MG PO TABS
10.0000 mg | ORAL_TABLET | ORAL | Status: DC | PRN
  Administered 2017-06-08 – 2017-06-11 (×8): 10 mg via ORAL
  Filled 2017-06-08 (×8): qty 2

## 2017-06-08 MED ORDER — ONDANSETRON HCL 4 MG/2ML IJ SOLN
INTRAMUSCULAR | Status: AC
  Filled 2017-06-08: qty 4

## 2017-06-08 MED ORDER — ONDANSETRON HCL 4 MG/2ML IJ SOLN
INTRAMUSCULAR | Status: DC | PRN
  Administered 2017-06-08: 4 mg via INTRAVENOUS

## 2017-06-08 MED ORDER — CHLORHEXIDINE GLUCONATE CLOTH 2 % EX PADS: 6.0000 | MEDICATED_PAD | Freq: Once | CUTANEOUS | Status: DC

## 2017-06-08 MED ORDER — ASPIRIN EC 81 MG PO TBEC
81.0000 mg | DELAYED_RELEASE_TABLET | Freq: Every day | ORAL | Status: DC
  Administered 2017-06-08 – 2017-06-11 (×4): 81 mg via ORAL
  Filled 2017-06-08 (×4): qty 1

## 2017-06-08 MED ORDER — DEXAMETHASONE SODIUM PHOSPHATE 10 MG/ML IJ SOLN
INTRAMUSCULAR | Status: AC
  Filled 2017-06-08: qty 2

## 2017-06-08 MED ORDER — ACETAMINOPHEN 500 MG PO TABS
500.0000 mg | ORAL_TABLET | Freq: Every day | ORAL | Status: DC | PRN
  Administered 2017-06-10 (×2): 500 mg via ORAL
  Filled 2017-06-08 (×2): qty 1

## 2017-06-08 MED ORDER — BUPIVACAINE HCL (PF) 0.5 % IJ SOLN
INTRAMUSCULAR | Status: DC | PRN
  Administered 2017-06-08: 10 mL
  Administered 2017-06-08: 9 mL

## 2017-06-08 MED ORDER — SUCCINYLCHOLINE CHLORIDE 20 MG/ML IJ SOLN
INTRAMUSCULAR | Status: DC | PRN
  Administered 2017-06-08: 120 mg via INTRAVENOUS

## 2017-06-08 MED ORDER — ONDANSETRON HCL 4 MG/2ML IJ SOLN
4.0000 mg | Freq: Four times a day (QID) | INTRAMUSCULAR | Status: DC | PRN
  Administered 2017-06-09: 4 mg via INTRAVENOUS
  Filled 2017-06-08: qty 2

## 2017-06-08 MED ORDER — SODIUM CHLORIDE 0.9 % IV SOLN: 250.0000 mL | INTRAVENOUS | Status: DC

## 2017-06-08 MED ORDER — ROCURONIUM BROMIDE 50 MG/5ML IV SOLN
INTRAVENOUS | Status: AC
  Filled 2017-06-08: qty 4

## 2017-06-08 MED ORDER — BISACODYL 10 MG RE SUPP
10.0000 mg | Freq: Every day | RECTAL | Status: DC | PRN
  Administered 2017-06-10: 10 mg via RECTAL
  Filled 2017-06-08: qty 1

## 2017-06-08 MED ORDER — PROPOFOL 500 MG/50ML IV EMUL
INTRAVENOUS | Status: DC | PRN
Start: 2017-06-08 — End: 2017-06-08
  Administered 2017-06-08: 75 ug/kg/min via INTRAVENOUS

## 2017-06-08 MED ORDER — VANCOMYCIN HCL IN DEXTROSE 1-5 GM/200ML-% IV SOLN
1000.0000 mg | INTRAVENOUS | Status: AC
  Administered 2017-06-08: 1000 mg via INTRAVENOUS

## 2017-06-08 MED ORDER — LIDOCAINE-EPINEPHRINE 1 %-1:100000 IJ SOLN
INTRAMUSCULAR | Status: DC | PRN
  Administered 2017-06-08: 9 mL
  Administered 2017-06-08: 20 mL

## 2017-06-08 MED ORDER — MORPHINE SULFATE (PF) 2 MG/ML IV SOLN
2.0000 mg | INTRAVENOUS | Status: DC | PRN
  Administered 2017-06-09: 2 mg via INTRAVENOUS
  Filled 2017-06-08: qty 1

## 2017-06-08 MED ORDER — FLEET ENEMA 7-19 GM/118ML RE ENEM: 1.0000 | ENEMA | Freq: Once | RECTAL | Status: DC | PRN

## 2017-06-08 MED ORDER — SALINE SPRAY 0.65 % NA SOLN
1.0000 | NASAL | Status: DC | PRN
  Filled 2017-06-08: qty 44

## 2017-06-08 MED ORDER — MIDAZOLAM HCL 5 MG/5ML IJ SOLN
INTRAMUSCULAR | Status: DC | PRN
  Administered 2017-06-08: 2 mg via INTRAVENOUS

## 2017-06-08 MED ORDER — DOCUSATE SODIUM 100 MG PO CAPS
100.0000 mg | ORAL_CAPSULE | Freq: Two times a day (BID) | ORAL | Status: DC
  Administered 2017-06-08 – 2017-06-11 (×6): 100 mg via ORAL
  Filled 2017-06-08 (×6): qty 1

## 2017-06-08 MED ORDER — FENTANYL CITRATE (PF) 250 MCG/5ML IJ SOLN
INTRAMUSCULAR | Status: AC
Start: 2017-06-08 — End: ?
  Filled 2017-06-08: qty 5

## 2017-06-08 MED ORDER — KCL IN DEXTROSE-NACL 20-5-0.45 MEQ/L-%-% IV SOLN
INTRAVENOUS | Status: DC
  Administered 2017-06-08: 1000 mL via INTRAVENOUS

## 2017-06-08 MED ORDER — MELATONIN 5 MG PO TABS
5.0000 mg | ORAL_TABLET | Freq: Every day | ORAL | Status: DC
  Filled 2017-06-08: qty 1

## 2017-06-08 MED ORDER — ONDANSETRON HCL 4 MG PO TABS: 4.0000 mg | ORAL_TABLET | Freq: Four times a day (QID) | ORAL | Status: DC | PRN

## 2017-06-08 MED ORDER — ACETAMINOPHEN 650 MG RE SUPP: 650.0000 mg | RECTAL | Status: DC | PRN

## 2017-06-08 MED ORDER — FAMOTIDINE IN NACL 20-0.9 MG/50ML-% IV SOLN
20.0000 mg | Freq: Two times a day (BID) | INTRAVENOUS | Status: DC
  Administered 2017-06-08 – 2017-06-09 (×2): 20 mg via INTRAVENOUS
  Filled 2017-06-08: qty 50

## 2017-06-08 MED ORDER — 0.9 % SODIUM CHLORIDE (POUR BTL) OPTIME
TOPICAL | Status: DC | PRN
  Administered 2017-06-08: 1000 mL

## 2017-06-08 MED ORDER — MIDAZOLAM HCL 2 MG/2ML IJ SOLN
INTRAMUSCULAR | Status: AC
  Filled 2017-06-08: qty 2

## 2017-06-08 MED ORDER — PROPOFOL 10 MG/ML IV BOLUS
INTRAVENOUS | Status: AC
  Filled 2017-06-08: qty 20

## 2017-06-08 MED ORDER — PHENOL 1.4 % MT LIQD: 1.0000 | OROMUCOSAL | Status: DC | PRN

## 2017-06-08 MED ORDER — POLYETHYLENE GLYCOL 3350 17 G PO PACK
17.0000 g | PACK | Freq: Every day | ORAL | Status: DC | PRN
  Administered 2017-06-09: 17 g via ORAL
  Filled 2017-06-08: qty 1

## 2017-06-08 MED ORDER — SODIUM CHLORIDE-SODIUM BICARB 1.57 G NA PACK: PACK | Freq: Every day | NASAL | Status: DC | PRN

## 2017-06-08 MED ORDER — DEXAMETHASONE SODIUM PHOSPHATE 4 MG/ML IJ SOLN
INTRAMUSCULAR | Status: DC | PRN
  Administered 2017-06-08: 10 mg via INTRAVENOUS

## 2017-06-08 MED ORDER — EPHEDRINE SULFATE 50 MG/ML IJ SOLN
INTRAMUSCULAR | Status: AC
  Filled 2017-06-08: qty 1

## 2017-06-08 MED ORDER — HYDROMORPHONE HCL 2 MG/ML IJ SOLN
INTRAMUSCULAR | Status: AC
  Administered 2017-06-08: 0.5 mg via INTRAVENOUS
  Filled 2017-06-08: qty 1

## 2017-06-08 MED ORDER — FENTANYL CITRATE (PF) 250 MCG/5ML IJ SOLN
INTRAMUSCULAR | Status: AC
  Filled 2017-06-08: qty 5

## 2017-06-08 MED ORDER — PHENYLEPHRINE HCL 10 MG/ML IJ SOLN
INTRAVENOUS | Status: DC | PRN
  Administered 2017-06-08: 40 ug/min via INTRAVENOUS

## 2017-06-08 MED ORDER — DIPHENHYDRAMINE HCL 25 MG PO CAPS
25.0000 mg | ORAL_CAPSULE | Freq: Every day | ORAL | Status: DC
  Administered 2017-06-08 – 2017-06-10 (×3): 25 mg via ORAL
  Filled 2017-06-08 (×4): qty 1

## 2017-06-08 MED ORDER — LIDOCAINE 2% (20 MG/ML) 5 ML SYRINGE
INTRAMUSCULAR | Status: AC
  Filled 2017-06-08: qty 10

## 2017-06-08 SURGICAL SUPPLY — 75 items
BLADE CLIPPER SURG (BLADE) ×3 IMPLANT
CARTRIDGE OIL MAESTRO DRILL (MISCELLANEOUS) IMPLANT
CLIP NEUROVISION LG (CLIP) ×3 IMPLANT
CONT SPEC 4OZ CLIKSEAL STRL BL (MISCELLANEOUS) ×3 IMPLANT
COVER BACK TABLE 24X17X13 BIG (DRAPES) IMPLANT
COVER BACK TABLE 60X90IN (DRAPES) ×3 IMPLANT
DECANTER SPIKE VIAL GLASS SM (MISCELLANEOUS) ×6 IMPLANT
DERMABOND ADVANCED (GAUZE/BANDAGES/DRESSINGS) ×4
DERMABOND ADVANCED .7 DNX12 (GAUZE/BANDAGES/DRESSINGS) ×2 IMPLANT
DIFFUSER DRILL AIR PNEUMATIC (MISCELLANEOUS) IMPLANT
DRAPE C-ARM 42X72 X-RAY (DRAPES) ×6 IMPLANT
DRAPE C-ARMOR (DRAPES) ×6 IMPLANT
DRAPE LAPAROTOMY 100X72X124 (DRAPES) ×6 IMPLANT
DRAPE POUCH INSTRU U-SHP 10X18 (DRAPES) ×3 IMPLANT
DRAPE SURG 17X23 STRL (DRAPES) ×3 IMPLANT
DRSG OPSITE POSTOP 3X4 (GAUZE/BANDAGES/DRESSINGS) ×6 IMPLANT
DRSG OPSITE POSTOP 4X6 (GAUZE/BANDAGES/DRESSINGS) ×3 IMPLANT
DRSG OPSITE POSTOP 4X8 (GAUZE/BANDAGES/DRESSINGS) ×3 IMPLANT
DURAPREP 26ML APPLICATOR (WOUND CARE) ×6 IMPLANT
ELECT REM PT RETURN 9FT ADLT (ELECTROSURGICAL) ×6
ELECTRODE REM PT RTRN 9FT ADLT (ELECTROSURGICAL) ×2 IMPLANT
GAUZE SPONGE 4X4 12PLY STRL (GAUZE/BANDAGES/DRESSINGS) ×3 IMPLANT
GAUZE SPONGE 4X4 16PLY XRAY LF (GAUZE/BANDAGES/DRESSINGS) ×3 IMPLANT
GLOVE BIO SURGEON STRL SZ8 (GLOVE) ×9 IMPLANT
GLOVE BIOGEL PI IND STRL 8 (GLOVE) ×2 IMPLANT
GLOVE BIOGEL PI IND STRL 8.5 (GLOVE) ×3 IMPLANT
GLOVE BIOGEL PI INDICATOR 8 (GLOVE) ×4
GLOVE BIOGEL PI INDICATOR 8.5 (GLOVE) ×6
GLOVE ECLIPSE 8.0 STRL XLNG CF (GLOVE) ×6 IMPLANT
GLOVE EXAM NITRILE LRG STRL (GLOVE) IMPLANT
GLOVE EXAM NITRILE XL STR (GLOVE) IMPLANT
GLOVE EXAM NITRILE XS STR PU (GLOVE) IMPLANT
GOWN STRL REUS W/ TWL LRG LVL3 (GOWN DISPOSABLE) IMPLANT
GOWN STRL REUS W/ TWL XL LVL3 (GOWN DISPOSABLE) ×3 IMPLANT
GOWN STRL REUS W/TWL 2XL LVL3 (GOWN DISPOSABLE) IMPLANT
GOWN STRL REUS W/TWL LRG LVL3 (GOWN DISPOSABLE)
GOWN STRL REUS W/TWL XL LVL3 (GOWN DISPOSABLE) ×6
GUIDEWIRE NITINOL BEVEL TIP (WIRE) ×3 IMPLANT
KIT BASIN OR (CUSTOM PROCEDURE TRAY) ×6 IMPLANT
KIT DILATOR XLIF 5 (KITS) ×2 IMPLANT
KIT INFUSE SMALL (Orthopedic Implant) ×3 IMPLANT
KIT POSITION SURG JACKSON T1 (MISCELLANEOUS) ×3 IMPLANT
KIT SURGICAL ACCESS MAXCESS 4 (KITS) ×3 IMPLANT
KIT TURNOVER KIT B (KITS) ×6 IMPLANT
KIT XLIF (KITS) ×1
MARKER SKIN DUAL TIP RULER LAB (MISCELLANEOUS) ×3 IMPLANT
MODULE NVM5 NEXT GEN EMG (NEEDLE) ×3 IMPLANT
MODULUS XLW 12X22X55MM 10 (Spine Construct) ×3 IMPLANT
MODULUS XLW 12X22X60MM 10 (Spine Construct) ×6 IMPLANT
NEEDLE HYPO 25X1 1.5 SAFETY (NEEDLE) ×6 IMPLANT
NEEDLE I PASS (NEEDLE) ×3 IMPLANT
NS IRRIG 1000ML POUR BTL (IV SOLUTION) ×6 IMPLANT
OIL CARTRIDGE MAESTRO DRILL (MISCELLANEOUS)
PACK LAMINECTOMY NEURO (CUSTOM PROCEDURE TRAY) ×6 IMPLANT
PAD ARMBOARD 7.5X6 YLW CONV (MISCELLANEOUS) ×9 IMPLANT
PATTIES SURGICAL .5 X.5 (GAUZE/BANDAGES/DRESSINGS) IMPLANT
PATTIES SURGICAL .5 X1 (DISPOSABLE) IMPLANT
PATTIES SURGICAL 1X1 (DISPOSABLE) IMPLANT
PUTTY BONE ATTRAX 10CC STRIP (Putty) ×3 IMPLANT
PUTTY BONE ATTRAX 5CC STRIP (Putty) ×3 IMPLANT
ROD RELINE MAS LORD 5.5X110MM (Rod) ×6 IMPLANT
SCREW LOCK RELINE 5.5 TULIP (Screw) ×24 IMPLANT
SCREW MAS RELINE 6.5X45 POLY (Screw) ×12 IMPLANT
SCREW RELINE MAS POLY 7.5X45MM (Screw) ×12 IMPLANT
SPONGE LAP 4X18 X RAY DECT (DISPOSABLE) IMPLANT
STAPLER SKIN PROX WIDE 3.9 (STAPLE) ×3 IMPLANT
SUT VIC AB 1 CT1 18XBRD ANBCTR (SUTURE) ×2 IMPLANT
SUT VIC AB 1 CT1 8-18 (SUTURE) ×4
SUT VIC AB 2-0 CT1 18 (SUTURE) ×6 IMPLANT
SUT VIC AB 3-0 SH 8-18 (SUTURE) ×9 IMPLANT
SYR INSULIN 1ML 31GX6 SAFETY (SYRINGE) IMPLANT
TOWEL GREEN STERILE (TOWEL DISPOSABLE) ×3 IMPLANT
TOWEL GREEN STERILE FF (TOWEL DISPOSABLE) ×3 IMPLANT
TRAY FOLEY MTR SLVR 16FR STAT (SET/KITS/TRAYS/PACK) ×3 IMPLANT
WATER STERILE IRR 1000ML POUR (IV SOLUTION) ×6 IMPLANT

## 2017-06-08 NOTE — Anesthesia Procedure Notes (Signed)
Procedure Name: Intubation Date/Time: 06/08/2017 12:00 PM Performed by: Atharva Mirsky T, CRNA Pre-anesthesia Checklist: Patient identified, Emergency Drugs available, Suction available and Patient being monitored Patient Re-evaluated:Patient Re-evaluated prior to induction Oxygen Delivery Method: Circle system utilized Preoxygenation: Pre-oxygenation with 100% oxygen Induction Type: IV induction Ventilation: Mask ventilation without difficulty and Oral airway inserted - appropriate to patient size Laryngoscope Size: Miller and 3 Grade View: Grade II Tube type: Oral Tube size: 7.5 mm Number of attempts: 1 Airway Equipment and Method: Patient positioned with wedge pillow and Stylet Placement Confirmation: ETT inserted through vocal cords under direct vision,  positive ETCO2 and breath sounds checked- equal and bilateral Secured at: 22 cm Tube secured with: Tape Dental Injury: Teeth and Oropharynx as per pre-operative assessment

## 2017-06-08 NOTE — Transfer of Care (Signed)
Immediate Anesthesia Transfer of Care Note  Patient: Thomas Berger  Procedure(s) Performed: LEFT LUMBAR 2- LUMBAR 3, LUMBAR 3- LUMBAR 4, LUMBAR 4- LUMBAR 5 ANTERIOR LATERAL LUMBAR INTERBODY FUSION (Left ) LUMBAR PERCUTANEOUS PEDICLE SCREW 3 LEVEL (Left )  Patient Location: PACU  Anesthesia Type:General  Level of Consciousness: awake and drowsy  Airway & Oxygen Therapy: Patient Spontanous Breathing and Patient connected to nasal cannula oxygen  Post-op Assessment: Report given to RN, Post -op Vital signs reviewed and stable and Patient moving all extremities  Post vital signs: Reviewed and stable  Last Vitals:  Vitals Value Taken Time  BP 117/83 06/08/2017  5:07 PM  Temp 36.4 C 06/08/2017  5:07 PM  Pulse 87 06/08/2017  5:17 PM  Resp 12 06/08/2017  5:17 PM  SpO2 96 % 06/08/2017  5:17 PM  Vitals shown include unvalidated device data.  Last Pain:  Vitals:   06/08/17 1707  TempSrc:   PainSc: Asleep         Complications: No apparent anesthesia complications

## 2017-06-08 NOTE — Brief Op Note (Signed)
06/08/2017  4:56 PM  PATIENT:  Thomas Berger  72 y.o. male  PRE-OPERATIVE DIAGNOSIS:  SPINAL STENOSIS OF LUMBAR REGION WITH NEUROGENIC CLAUDICATION, lumbar spondylolisthesis, sagittal plane imbalance, lumbago, radiculopathy  POST-OPERATIVE DIAGNOSIS:  SPINAL STENOSIS OF LUMBAR REGION WITH NEUROGENIC CLAUDICATION, lumbar spondylolisthesis, sagittal plane imbalance, lumbago, radiculopathy  PROCEDURE:  Procedure(s) with comments: LEFT LUMBAR 2- LUMBAR 3, LUMBAR 3- LUMBAR 4, LUMBAR 4- LUMBAR 5 ANTERIOR LATERAL LUMBAR INTERBODY FUSION (Left) - LEFT LUMBAR 2- LUMBAR 3, LUMBAR 3- LUMBAR 4, LUMBAR 4- LUMBAR 5 ANTERIOR LATERAL LUMBAR INTERBODY FUSION LUMBAR PERCUTANEOUS PEDICLE SCREW 3 LEVEL (Left) - LUMBAR PERCUTANEOUS PEDICLE SCREW 3 LEVEL L 2 - L 5 bilaterally  SURGEON:  Surgeon(s) and Role:    Erline Levine, MD - Primary  PHYSICIAN ASSISTANT: Ronnald Ramp, MD  ASSISTANTS: Poteat, RN   ANESTHESIA:   general  EBL:  200 mL   BLOOD ADMINISTERED:none  DRAINS: none   LOCAL MEDICATIONS USED:  MARCAINE    and LIDOCAINE   SPECIMEN:  No Specimen  DISPOSITION OF SPECIMEN:  N/A  COUNTS:  YES  TOURNIQUET:  * No tourniquets in log *  DICTATION: Patient is a 72 year old with severe spondylosis, spondylolisthesis,  stenosis and sagittal imbalance of the lumbar spine. It was elected to take him to surgery for anterolateral decompression and posterior pedicle screw fixation.    Procedure: Patient was brought to the operating room and placed in a right lateral decubitus position on the operative table and using orthogonally projected C-arm fluoroscopy the patient was placed so that the L 23 L 34 and  L 45 levels were visualized in AP and lateral plane. The patient was then taped into position. The table was flexed so as to expose the L 45 level as the patient has a high iliac crest. Skin was marked along with a posterior finger dissection incision. His flank was then prepped and draped in usual sterile  fashion and incisions were made sequentially at L 45,  L34 and L23 levels. Posterior finger dissection was made to enter the retroperitoneal space and then subsequently the probe was inserted into the psoas muscle from the left side initially at the L 45 level. After mapping the neural elements were able to dock the probe per the midpoint of this vertebral level and without indications electrically of too close proximity to the neural tissues. Subsequently the self-retaining tractor was.after sequential dilators were utilized the shim was employed and the interspace was cleared of psoas muscle and then incised. A thorough discectomy was performed. Instruments were used to clear the interspace of disc material.An anterior entry with posterior trajectory was performed to avoid neural elements.   After thorough discectomy was performed and this was performed using AP and lateral fluoroscopy a 12 lordotic (10 degree) by 60 x 22 mm implant was packed with small BMP and Attrax. This was tamped into position using the slides and its position was confirmed on AP and lateral fluoroscopy. Subsequently exposure was performed at the L3-4 level and similar dissection was performed with locking of the self-retaining retractor. At this level were able to place an 12 lordotic by 10 degree by 22 x 29mm implant packed in a similar fashion. At the L2-3 level were able to place an 12 mm 10 degree lordotic by 55 x 22 mm implant packed in a similar fashion. Hemostasis was assured the wounds were irrigated and closed with interrupted Vicryl sutures.  Sterile occlusive dressings were placed.  Retractor times were:  L 45: 15  minutes;  L 34: 18 minutes; L 23: 19 minutes.   Patient was then turned into a prone position on the operating table on chest rolls and using AP and lateral fluoroscopy throughout this portion of the procedure, pedicle screws were placed using Reline Nuvasive cannulated percutaneous screws. 2 screws were placed at  L2 and (6.5 x 45 mm) and 2 at L3 (6.5 x 45), and two at L4 (7.5 x 45 mm),  2 at L5  (7.5 x 45). 110 mm rod was then affixed to the screw heads do a separate stab incision and locked down on the screws on the left and 110 mm rod on the right. All connections were then torqued and the Towers were disassembled. The wounds were irrigated and then closed with 1, 2-0 and 3-0 Vicryl stitches. Sterile occlusive dressing was placed with Dermabond. Marcaine was injected. The patient was then extubated in the operating room and taken to recovery in stable and satisfactory condition having tolerated his operation well. Counts were correct at the end of the case.  Pelvic Parameters:  Preop: PT 25; PI 50; LL21; PI-LL +29  PLAN OF CARE: Admit to inpatient   PATIENT DISPOSITION:  PACU - hemodynamically stable.   Delay start of Pharmacological VTE agent (>24hrs) due to surgical blood loss or risk of bleeding: yes

## 2017-06-08 NOTE — Anesthesia Postprocedure Evaluation (Signed)
Anesthesia Post Note  Patient: Thomas Berger  Procedure(s) Performed: LEFT LUMBAR 2- LUMBAR 3, LUMBAR 3- LUMBAR 4, LUMBAR 4- LUMBAR 5 ANTERIOR LATERAL LUMBAR INTERBODY FUSION (Left ) LUMBAR PERCUTANEOUS PEDICLE SCREW 3 LEVEL (Left )     Patient location during evaluation: PACU Anesthesia Type: General Level of consciousness: awake Pain management: pain level controlled Vital Signs Assessment: post-procedure vital signs reviewed and stable Respiratory status: spontaneous breathing Cardiovascular status: stable Anesthetic complications: no    Last Vitals:  Vitals:   06/08/17 0945 06/08/17 1707  BP: (!) 142/67 117/83  Pulse:  87  Resp:  16  Temp:  36.4 C  SpO2:  97%    Last Pain:  Vitals:   06/08/17 1707  TempSrc:   PainSc: Asleep                 Aolanis Crispen

## 2017-06-08 NOTE — Op Note (Signed)
06/08/2017  4:56 PM  PATIENT:  Thomas Berger  72 y.o. male  PRE-OPERATIVE DIAGNOSIS:  SPINAL STENOSIS OF LUMBAR REGION WITH NEUROGENIC CLAUDICATION, lumbar spondylolisthesis, sagittal plane imbalance, lumbago, radiculopathy  POST-OPERATIVE DIAGNOSIS:  SPINAL STENOSIS OF LUMBAR REGION WITH NEUROGENIC CLAUDICATION, lumbar spondylolisthesis, sagittal plane imbalance, lumbago, radiculopathy  PROCEDURE:  Procedure(s) with comments: LEFT LUMBAR 2- LUMBAR 3, LUMBAR 3- LUMBAR 4, LUMBAR 4- LUMBAR 5 ANTERIOR LATERAL LUMBAR INTERBODY FUSION (Left) - LEFT LUMBAR 2- LUMBAR 3, LUMBAR 3- LUMBAR 4, LUMBAR 4- LUMBAR 5 ANTERIOR LATERAL LUMBAR INTERBODY FUSION LUMBAR PERCUTANEOUS PEDICLE SCREW 3 LEVEL (Left) - LUMBAR PERCUTANEOUS PEDICLE SCREW 3 LEVEL L 2 - L 5 bilaterally  SURGEON:  Surgeon(s) and Role:    Erline Levine, MD - Primary  PHYSICIAN ASSISTANT: Ronnald Ramp, MD  ASSISTANTS: Poteat, RN   ANESTHESIA:   general  EBL:  200 mL   BLOOD ADMINISTERED:none  DRAINS: none   LOCAL MEDICATIONS USED:  MARCAINE    and LIDOCAINE   SPECIMEN:  No Specimen  DISPOSITION OF SPECIMEN:  N/A  COUNTS:  YES  TOURNIQUET:  * No tourniquets in log *  DICTATION: Patient is a 72 year old with severe spondylosis, spondylolisthesis,  stenosis and sagittal imbalance of the lumbar spine. It was elected to take him to surgery for anterolateral decompression and posterior pedicle screw fixation.    Procedure: Patient was brought to the operating room and placed in a right lateral decubitus position on the operative table and using orthogonally projected C-arm fluoroscopy the patient was placed so that the L 23 L 34 and  L 45 levels were visualized in AP and lateral plane. The patient was then taped into position. The table was flexed so as to expose the L 45 level as the patient has a high iliac crest. Skin was marked along with a posterior finger dissection incision. His flank was then prepped and draped in usual sterile  fashion and incisions were made sequentially at L 45,  L34 and L23 levels. Posterior finger dissection was made to enter the retroperitoneal space and then subsequently the probe was inserted into the psoas muscle from the left side initially at the L 45 level. After mapping the neural elements were able to dock the probe per the midpoint of this vertebral level and without indications electrically of too close proximity to the neural tissues. Subsequently the self-retaining tractor was.after sequential dilators were utilized the shim was employed and the interspace was cleared of psoas muscle and then incised. A thorough discectomy was performed. Instruments were used to clear the interspace of disc material.An anterior entry with posterior trajectory was performed to avoid neural elements.   After thorough discectomy was performed and this was performed using AP and lateral fluoroscopy a 12 lordotic (10 degree) by 60 x 22 mm implant was packed with small BMP and Attrax. This was tamped into position using the slides and its position was confirmed on AP and lateral fluoroscopy. Subsequently exposure was performed at the L3-4 level and similar dissection was performed with locking of the self-retaining retractor. At this level were able to place an 12 lordotic by 10 degree by 22 x 30mm implant packed in a similar fashion. At the L2-3 level were able to place an 12 mm 10 degree lordotic by 55 x 22 mm implant packed in a similar fashion. Hemostasis was assured the wounds were irrigated and closed with interrupted Vicryl sutures.  Sterile occlusive dressings were placed.  Retractor times were:  L 45: 15  minutes;  L 34: 18 minutes; L 23: 19 minutes.   Patient was then turned into a prone position on the operating table on chest rolls and using AP and lateral fluoroscopy throughout this portion of the procedure, pedicle screws were placed using Reline Nuvasive cannulated percutaneous screws. 2 screws were placed at  L2 and (6.5 x 45 mm) and 2 at L3 (6.5 x 45), and two at L4 (7.5 x 45 mm),  2 at L5  (7.5 x 45). 110 mm rod was then affixed to the screw heads do a separate stab incision and locked down on the screws on the left and 110 mm rod on the right. All connections were then torqued and the Towers were disassembled. The wounds were irrigated and then closed with 1, 2-0 and 3-0 Vicryl stitches. Sterile occlusive dressing was placed with Dermabond. Marcaine was injected. The patient was then extubated in the operating room and taken to recovery in stable and satisfactory condition having tolerated his operation well. Counts were correct at the end of the case.  Pelvic Parameters:  Preop: PT 25; PI 50; LL21; PI-LL +29  PLAN OF CARE: Admit to inpatient   PATIENT DISPOSITION:  PACU - hemodynamically stable.   Delay start of Pharmacological VTE agent (>24hrs) due to surgical blood loss or risk of bleeding: yes

## 2017-06-08 NOTE — Interval H&P Note (Signed)
History and Physical Interval Note:  06/08/2017 11:02 AM  Thomas Berger  has presented today for surgery, with the diagnosis of SPINAL STENOSIS OF LUMBAR REGION WITH NEUROGENIC CLAUDICATION  The various methods of treatment have been discussed with the patient and family. After consideration of risks, benefits and other options for treatment, the patient has consented to  Procedure(s) with comments: LEFT LUMBAR 2- LUMBAR 3, LUMBAR 3- LUMBAR 4, LUMBAR 4- LUMBAR 5 ANTERIOR LATERAL LUMBAR INTERBODY FUSION (Left) - LEFT LUMBAR 2- LUMBAR 3, LUMBAR 3- LUMBAR 4, LUMBAR 4- LUMBAR 5 ANTERIOR LATERAL LUMBAR INTERBODY FUSION LUMBAR PERCUTANEOUS PEDICLE SCREW 3 LEVEL (Left) - LUMBAR PERCUTANEOUS PEDICLE SCREW 3 LEVEL as a surgical intervention .  [name] history has been reviewed, patient examined, no change in status, stable for surgery.  I have reviewed the patient's chart and labs.  Questions were answered to the patient's satisfaction.     Maronda Caison D

## 2017-06-08 NOTE — Anesthesia Preprocedure Evaluation (Addendum)
Anesthesia Evaluation  Patient identified by MRN, date of birth, ID band Patient awake    Reviewed: Allergy & Precautions, NPO status , Patient's Chart, lab work & pertinent test results  History of Anesthesia Complications (+) Emergence Delirium  Airway Mallampati: II  TM Distance: >3 FB     Dental   Pulmonary neg pulmonary ROS,    breath sounds clear to auscultation       Cardiovascular hypertension,  Rhythm:Regular Rate:Normal     Neuro/Psych    GI/Hepatic negative GI ROS, Neg liver ROS,   Endo/Other  negative endocrine ROS  Renal/GU negative Renal ROS     Musculoskeletal   Abdominal   Peds  Hematology   Anesthesia Other Findings   Reproductive/Obstetrics                            Anesthesia Physical Anesthesia Plan  ASA: III  Anesthesia Plan: General   Post-op Pain Management:    Induction: Intravenous  PONV Risk Score and Plan: Treatment may vary due to age or medical condition  Airway Management Planned: Oral ETT  Additional Equipment:   Intra-op Plan:   Post-operative Plan:   Informed Consent: I have reviewed the patients History and Physical, chart, labs and discussed the procedure including the risks, benefits and alternatives for the proposed anesthesia with the patient or authorized representative who has indicated his/her understanding and acceptance.   Dental advisory given  Plan Discussed with: CRNA and Anesthesiologist  Anesthesia Plan Comments:         Anesthesia Quick Evaluation

## 2017-06-08 NOTE — Progress Notes (Signed)
Pharmacy Antibiotic Note  Thomas Berger is a 72 y.o. male admitted on 06/08/2017 for spinal surgery.  Pharmacy has been consulted for vancomycin dosing for surgical prophylaxis.  Patient does not have a drain per op note.  He received pre-op vancomycin around 0930.  Baseline labs reviewed.   Plan: Vanc 1gm IV x 1 at 2130 Pharmacy will sign off.  Thank you for the consult!   Height: 5\' 8"  (172.7 cm) Weight: 208 lb (94.3 kg) IBW/kg (Calculated) : 68.4  Temp (24hrs), Avg:98 F (36.7 C), Min:97.6 F (36.4 C), Max:98.7 F (37.1 C)  No results for input(s): WBC, CREATININE, LATICACIDVEN, VANCOTROUGH, VANCOPEAK, VANCORANDOM, GENTTROUGH, GENTPEAK, GENTRANDOM, TOBRATROUGH, TOBRAPEAK, TOBRARND, AMIKACINPEAK, AMIKACINTROU, AMIKACIN in the last 168 hours.  Estimated Creatinine Clearance: 65.5 mL/min (by C-G formula based on SCr of 1.17 mg/dL).    Allergies  Allergen Reactions  . Penicillins Hives and Other (See Comments)    Has patient had a PCN reaction causing immediate rash, facial/tongue/throat swelling, SOB or lightheadedness with hypotension: No Has patient had a PCN reaction causing SEVERE rash involving mucus membranes or skin necrosis: #  #  #  YES  #  #  #  Has patient had a PCN reaction that required hospitalization: No Has patient had a PCN reaction occurring within the last 10 years: No If all of the above answers are "NO", then may proceed with Cephalosporin use.      Aasiyah Auerbach D. Mina Marble, PharmD, BCPS, BCCCP Pager:  [phone] 06/08/2017, 7:15 PM

## 2017-06-09 ENCOUNTER — Encounter (HOSPITAL_COMMUNITY): Payer: Self-pay | Admitting: General Practice

## 2017-06-09 LAB — CBC WITH DIFFERENTIAL/PLATELET
Basophils Absolute: 0 10*3/uL (ref 0.0–0.1)
Basophils Relative: 0 %
Eosinophils Absolute: 0 10*3/uL (ref 0.0–0.7)
Eosinophils Relative: 0 %
HCT: 36.2 % — ABNORMAL LOW (ref 39.0–52.0)
Hemoglobin: 12.1 g/dL — ABNORMAL LOW (ref 13.0–17.0)
Lymphocytes Relative: 8 %
Lymphs Abs: 1 10*3/uL (ref 0.7–4.0)
MCH: 30 pg (ref 26.0–34.0)
MCHC: 33.4 g/dL (ref 30.0–36.0)
MCV: 89.8 fL (ref 78.0–100.0)
Monocytes Absolute: 1.5 10*3/uL — ABNORMAL HIGH (ref 0.1–1.0)
Monocytes Relative: 12 %
Neutro Abs: 9.8 10*3/uL — ABNORMAL HIGH (ref 1.7–7.7)
Neutrophils Relative %: 80 %
Platelets: 197 10*3/uL (ref 150–400)
RBC: 4.03 MIL/uL — ABNORMAL LOW (ref 4.22–5.81)
RDW: 12.2 % (ref 11.5–15.5)
WBC: 12.2 10*3/uL — ABNORMAL HIGH (ref 4.0–10.5)

## 2017-06-09 LAB — BASIC METABOLIC PANEL
Anion gap: 9 (ref 5–15)
BUN: 18 mg/dL (ref 6–20)
CO2: 26 mmol/L (ref 22–32)
Calcium: 8.8 mg/dL — ABNORMAL LOW (ref 8.9–10.3)
Chloride: 100 mmol/L — ABNORMAL LOW (ref 101–111)
Creatinine, Ser: 1.09 mg/dL (ref 0.61–1.24)
GFR calc Af Amer: >60 mL/min (ref >60)
GFR calc non Af Amer: >60 mL/min (ref >60)
Glucose, Bld: 128 mg/dL — ABNORMAL HIGH (ref 65–99)
Potassium: 4.9 mmol/L (ref 3.5–5.1)
Sodium: 135 mmol/L (ref 135–145)

## 2017-06-09 MED ORDER — FAMOTIDINE 20 MG PO TABS
20.0000 mg | ORAL_TABLET | Freq: Two times a day (BID) | ORAL | Status: DC
Start: 2017-06-09 — End: 2017-06-11
  Administered 2017-06-09 – 2017-06-11 (×4): 20 mg via ORAL
  Filled 2017-06-09 (×4): qty 1

## 2017-06-09 NOTE — Progress Notes (Signed)
Neurosurgery Progress Note  Patient tried to ambulate yesterday but resulted in nausea. Resolved with zofran Feels well this am. Appropriate back soreness. Feels as though his left hip is weak. Eager to start ambulating again and work on recovery  EXAM:  BP 113/67 (BP Location: Left Arm)   Pulse 75   Temp 98.1 F (36.7 C) (Oral)   Resp 14   Ht 5\' 8"  (1.727 m)   Wt 94.3 kg (208 lb)   SpO2 96%   BMI 31.63 kg/m   Awake, alert, oriented  Speech fluent, appropriate  CN grossly intact  MAEW. Mild left hip flexor weakness  PLAN Looks good this am.  Back appropriately sore. Left hip flexor weakness not unexpected back on lateral approach Pre op numbness in BLE with ambulation: has not ambulated enough to assess difference. Will have him work with therapy today. Hopeful for d/c tomorrow. Nausea with ambulation yesterday, resolved with zofran. Likely relate to anesthesia. Will check post op CBC and BMP.

## 2017-06-09 NOTE — Evaluation (Signed)
Physical Therapy Evaluation Patient Details Name: Thomas Berger MRN: [id number] DOB: 08-04-1945 Today's Date: 06/09/2017   History of Present Illness  Pt is a 72 y.o. male s/p L2-5 XLIF.   Clinical Impression  Patient is s/p above surgery resulting in the deficits listed below (see PT Problem List). PTA pt independent with all functional mobility. On eval he required min guard assist transfers and ambulation 200 feet with RW. Pt has 3 steps with 2 rails to enter his one-level house. Patient will benefit from skilled PT to increase their independence and safety with mobility (while adhering to their precautions) to allow discharge to the venue listed below. PT to follow acutely. No follow up services indicated.      Follow Up Recommendations No PT follow up;Supervision for mobility/OOB    Equipment Recommendations  Rolling walker with 5" wheels    Recommendations for Other Services       Precautions / Restrictions Precautions Precautions: Back Precaution Booklet Issued: Yes (comment) Precaution Comments: Educated pt on 3/3 back precautions. Handout provided.  Required Braces or Orthoses: Spinal Brace Spinal Brace: Lumbar corset;Applied in sitting position      Mobility  Bed Mobility               General bed mobility comments: Pt received OOB.  Transfers Overall transfer level: Needs assistance Equipment used: Rolling walker (2 wheeled) Transfers: Sit to/from Stand Sit to Stand: Min guard         General transfer comment: verbal cues for hand placement  Ambulation/Gait Ambulation/Gait assistance: Min guard Ambulation Distance (Feet): 200 Feet Assistive device: Rolling walker (2 wheeled) Gait Pattern/deviations: Step-through pattern;Decreased stride length Gait velocity: decreased Gait velocity interpretation: 1.31 - 2.62 ft/sec, indicative of limited community ambulator General Gait Details: verbal cues for posture  Stairs            Wheelchair  Mobility    Modified Rankin (Stroke Patients Only)       Balance Overall balance assessment: Needs assistance Sitting-balance support: No upper extremity supported;Feet supported Sitting balance-Leahy Scale: Good     Standing balance support: No upper extremity supported Standing balance-Leahy Scale: Fair Standing balance comment: static stand without AD                             Pertinent Vitals/Pain Pain Assessment: 0-10 Pain Score: 4  Pain Location: sx site Pain Descriptors / Indicators: Operative site guarding;Sore;Tender Pain Intervention(s): Monitored during session;Repositioned;Patient requesting pain meds-RN notified    Home Living Family/patient expects to be discharged to:: Private residence Living Arrangements: Spouse/significant other Available Help at Discharge: Family;Available 24 hours/day Type of Home: House Home Access: Stairs to enter Entrance Stairs-Rails: Right;Left;Can reach both Entrance Stairs-Number of Steps: 3 Home Layout: One level Home Equipment: Shower seat      Prior Function Level of Independence: Independent               Hand Dominance        Extremity/Trunk Assessment   Upper Extremity Assessment Upper Extremity Assessment: Defer to OT evaluation    Lower Extremity Assessment Lower Extremity Assessment: Overall WFL for tasks assessed    Cervical / Trunk Assessment Cervical / Trunk Assessment: Other exceptions Cervical / Trunk Exceptions: lumbar sx  Communication   Communication: No difficulties  Cognition Arousal/Alertness: Awake/alert Behavior During Therapy: WFL for tasks assessed/performed Overall Cognitive Status: Within Functional Limits for tasks assessed  General Comments      Exercises     Assessment/Plan    PT Assessment Patient needs continued PT services  PT Problem List Decreased mobility;Decreased knowledge of  precautions;Decreased activity tolerance;Decreased balance;Decreased knowledge of use of DME;Pain       PT Treatment Interventions DME instruction;Therapeutic activities;Gait training;Therapeutic exercise;Patient/family education;Stair training;Balance training;Functional mobility training    PT Goals (Current goals can be found in the Care Plan section)  Acute Rehab PT Goals Patient Stated Goal: home PT Goal Formulation: With patient Time For Goal Achievement: 06/23/17 Potential to Achieve Goals: Good    Frequency Min 5X/week   Barriers to discharge        Co-evaluation               AM-PAC PT "6 Clicks" Daily Activity  Outcome Measure Difficulty turning over in bed (including adjusting bedclothes, sheets and blankets)?: A Little Difficulty moving from lying on back to sitting on the side of the bed? : A Lot Difficulty sitting down on and standing up from a chair with arms (e.g., wheelchair, bedside commode, etc,.)?: A Little Help needed moving to and from a bed to chair (including a wheelchair)?: A Little Help needed walking in hospital room?: A Little Help needed climbing 3-5 steps with a railing? : A Little 6 Click Score: 17    End of Session Equipment Utilized During Treatment: Gait belt;Back brace Activity Tolerance: Patient tolerated treatment well Patient left: in chair;with call bell/phone within reach Nurse Communication: Mobility status;Patient requests pain meds PT Visit Diagnosis: Difficulty in walking, not elsewhere classified (R26.2);Pain    Time: 1157-2620 PT Time Calculation (min) (ACUTE ONLY): 18 min   Charges:   PT Evaluation $PT Eval Moderate Complexity: 1 Mod     PT G Codes:        Lorrin Goodell, PT  Office # [phone] Pager [phone]   Lorriane Shire 06/09/2017, 11:26 AM

## 2017-06-09 NOTE — Progress Notes (Signed)
Patient able to ambulate 100 or more feet became nauseous got Zofran 4 g IV feels better now. Is very ambitious but body not cooperating.

## 2017-06-09 NOTE — Evaluation (Signed)
Occupational Therapy Evaluation and Discharge Patient Details Name: Thomas Berger MRN: [id number] DOB: 1945-12-27 Today's Date: 06/09/2017    History of Present Illness Pt is a 72 y.o. male s/p L2-5 XLIF.    Clinical Impression   Pt reports he was independent with ADL PTA. Currently pt supervision with ADL and functional mobility with the exception of min assist for LB ADL. All back, safety, and ADL education completed with pt. Pt planning to d/c home with 24/7 supervision from family. No further acute OT needs identified; signing off at this time. Please re-consult if needs change. Thank you for this referral.    Follow Up Recommendations  No OT follow up;Supervision - Intermittent    Equipment Recommendations  None recommended by OT    Recommendations for Other Services       Precautions / Restrictions Precautions Precautions: Back Precaution Booklet Issued: No Precaution Comments: Pt able to recall 2/3 precautions; reviewed all with pt Required Braces or Orthoses: Spinal Brace Spinal Brace: Lumbar corset;Applied in sitting position Restrictions Weight Bearing Restrictions: No      Mobility Bed Mobility Overal bed mobility: Needs Assistance Bed Mobility: Rolling;Sidelying to Sit;Sit to Sidelying Rolling: Min assist Sidelying to sit: Supervision     Sit to sidelying: Min assist General bed mobility comments: Assist to roll LEs to R side, assist for LEs into bed. Cues throghout for technique. HOB flat with use of bed rail  Transfers Overall transfer level: Needs assistance Equipment used: Rolling walker (2 wheeled) Transfers: Sit to/from Stand Sit to Stand: Supervision         General transfer comment: Supervision for safety. Cues for hand placement.    Balance Overall balance assessment: Needs assistance Sitting-balance support: No upper extremity supported;Feet supported Sitting balance-Leahy Scale: Good     Standing balance support: Bilateral upper  extremity supported Standing balance-Leahy Scale: Poor Standing balance comment: static stand without AD                           ADL either performed or assessed with clinical judgement   ADL Overall ADL's : Needs assistance/impaired Eating/Feeding: Independent;Sitting   Grooming: Supervision/safety;Standing Grooming Details (indicate cue type and reason): Educated on use of 2 cups for oral care Upper Body Bathing: Set up;Sitting   Lower Body Bathing: Minimal assistance;Sit to/from stand   Upper Body Dressing : Set up;Sitting Upper Body Dressing Details (indicate cue type and reason): Educated on brace management and wear schedule; pt able to don brace with set up Lower Body Dressing: Minimal assistance;Sit to/from stand Lower Body Dressing Details (indicate cue type and reason): Pt unable to cross foot over opposite knee. Pt reports wife can assist with LB ADL as needed Toilet Transfer: Supervision/safety;Ambulation;RW Toilet Transfer Details (indicate cue type and reason): Simulated by sit to stand from EOB with functional mobility   Toileting - Clothing Manipulation Details (indicate cue type and reason): Educated on proper technique for peri care without twisting and use of wet wipes   Tub/Shower Transfer Details (indicate cue type and reason): Discussed potential use of shower chair if needed for bathing; pt verbalized understanding Functional mobility during ADLs: Supervision/safety;Rolling walker General ADL Comments: Educated pt on maintaining back precautions during functional activities, keeping frequently used items at counter top height, frequent mobility throughout the day upon return home, log roll for bed mobility     Vision         Perception     Praxis  Pertinent Vitals/Pain Pain Assessment: Faces Pain Score: 4  Faces Pain Scale: Hurts little more Pain Location: back Pain Descriptors / Indicators: Discomfort;Sore Pain Intervention(s):  Monitored during session     Hand Dominance     Extremity/Trunk Assessment Upper Extremity Assessment Upper Extremity Assessment: Overall WFL for tasks assessed   Lower Extremity Assessment Lower Extremity Assessment: Defer to PT evaluation   Cervical / Trunk Assessment Cervical / Trunk Assessment: Other exceptions Cervical / Trunk Exceptions: s/p spine sx   Communication Communication Communication: No difficulties   Cognition Arousal/Alertness: Awake/alert Behavior During Therapy: WFL for tasks assessed/performed Overall Cognitive Status: Within Functional Limits for tasks assessed                                     General Comments       Exercises     Shoulder Instructions      Home Living Family/patient expects to be discharged to:: Private residence Living Arrangements: Spouse/significant other Available Help at Discharge: Family;Available 24 hours/day Type of Home: House Home Access: Stairs to enter [employer] of Steps: 3 Entrance Stairs-Rails: Right;Left;Can reach both Home Layout: One level     Bathroom Shower/Tub: Occupational psychologist: Handicapped height Bathroom Accessibility: Yes   Home Equipment: Shower seat - built in;Grab bars - tub/shower;Bedside commode;Walker - 2 wheels          Prior Functioning/Environment Level of Independence: Independent                 OT Problem List:        OT Treatment/Interventions:      OT Goals(Current goals can be found in the care plan section) Acute Rehab OT Goals Patient Stated Goal: home OT Goal Formulation: All assessment and education complete, DC therapy  OT Frequency:     Barriers to D/C:            Co-evaluation              AM-PAC PT "6 Clicks" Daily Activity     Outcome Measure Help from another person eating meals?: None Help from another person taking care of personal grooming?: A Little Help from another person toileting, which  includes using toliet, bedpan, or urinal?: A Little Help from another person bathing (including washing, rinsing, drying)?: A Little Help from another person to put on and taking off regular upper body clothing?: None Help from another person to put on and taking off regular lower body clothing?: A Little 6 Click Score: 20   End of Session Equipment Utilized During Treatment: Rolling walker;Back brace Nurse Communication: Mobility status  Activity Tolerance: Patient tolerated treatment well Patient left: in bed;with call bell/phone within reach  OT Visit Diagnosis: Pain Pain - part of body: (back)                Time: 7253-6644 OT Time Calculation (min): 31 min Charges:  OT General Charges $OT Visit: 1 Visit OT Evaluation $OT Eval Moderate Complexity: 1 Mod OT Treatments $Self Care/Home Management : 8-22 mins G-Codes:     Karielle Davidow A. Ulice Brilliant, M.S., OTR/L Acute Rehab Department: [phone]  Binnie Kand 06/09/2017, 2:07 PM

## 2017-06-10 MED ORDER — MAGNESIUM CITRATE PO SOLN
1.0000 | Freq: Once | ORAL | Status: AC
  Administered 2017-06-10: 1 via ORAL
  Filled 2017-06-10: qty 296

## 2017-06-10 NOTE — Progress Notes (Signed)
Physical Therapy Treatment & Discharge Patient Details Name: Thomas Berger MRN: [id number] DOB: Nov 09, 1945 Today's Date: 06/10/2017    History of Present Illness Pt is a 72 y.o. male s/p L2-5 XLIF on 06/08/17. PMH includes HTN, arthritis.    PT Comments    Pt progressing well with mobility; only c/o constipation. Amb 600' mod indep with RW; initiated stair training. Good ability to maintain back precautions and don brace independently. Encouraged pt to continue ambulating during hospital admission (RN aware). Educ on fall risk reduction, importance of mobility, positioning, and precautions. Pt will have 24/7 supervision from wife upon return home. Has no further questions or concerns; met short-term acute PT goals. Will d/c acute PT.   Follow Up Recommendations  No PT follow up;Supervision - Intermittent     Equipment Recommendations  Rolling walker with 5" wheels    Recommendations for Other Services       Precautions / Restrictions Precautions Precautions: Back Precaution Comments: Pt able to recall 2/3 recautions, reviewed and able to recall 3/3 by end of session Required Braces or Orthoses: Spinal Brace Spinal Brace: Lumbar corset;Applied in sitting position    Mobility  Bed Mobility Overal bed mobility: Needs Assistance Bed Mobility: Rolling;Sidelying to Sit;Sit to Sidelying Rolling: Modified independent (Device/Increase time) Sidelying to sit: Modified independent (Device/Increase time)     Sit to sidelying: Min assist General bed mobility comments: MinA to assist BLEs back into bed upon side-lying (reports wife can assist with this at home). Increased time and effort for repositioning in bed, but no physical assist required  Transfers Overall transfer level: Modified independent Equipment used: Rolling walker (2 wheeled)   Sit to Stand: Modified independent (Device/Increase time)         General transfer comment: Stood from bed and toilet mod indep with  RW  Ambulation/Gait Ambulation/Gait assistance: Modified independent (Device/Increase time) Ambulation Distance (Feet): 600 Feet Assistive device: Rolling walker (2 wheeled) Gait Pattern/deviations: Step-through pattern;Decreased stride length Gait velocity: Decreased Gait velocity interpretation: 1.31 - 2.62 ft/sec, indicative of limited community ambulator General Gait Details: Slow, controlled amb with RW. Intermittent standing rest breaks to correct posture; good ability to self-correct. Intermittent c/o nausea   Stairs Stairs: Yes Stairs assistance: Supervision Stair Management: Two rails;Forwards Number of Stairs: 3 General stair comments: Ascend/descended 3 steps with BUE support on bilat rails; supervision for safety. Educ on technique   Wheelchair Mobility    Modified Rankin (Stroke Patients Only)       Balance Overall balance assessment: Needs assistance Sitting-balance support: No upper extremity supported;Feet supported Sitting balance-Leahy Scale: Good     Standing balance support: Bilateral upper extremity supported Standing balance-Leahy Scale: Fair Standing balance comment: static stand without AD                            Cognition Arousal/Alertness: Awake/alert Behavior During Therapy: WFL for tasks assessed/performed Overall Cognitive Status: Within Functional Limits for tasks assessed                                        Exercises      General Comments General comments (skin integrity, edema, etc.): Continues to have constipation, RN aware      Pertinent Vitals/Pain Pain Assessment: Faces Faces Pain Scale: Hurts even more Pain Location: back Pain Descriptors / Indicators: Discomfort;Sore;Guarding Pain Intervention(s): Monitored during session;Patient requesting pain  meds-RN notified    Home Living                      Prior Function            PT Goals (current goals can now be found in the  care plan section) Acute Rehab PT Goals Patient Stated Goal: home PT Goal Formulation: With patient Time For Goal Achievement: 06/23/17 Progress towards PT goals: Goals met/education completed, patient discharged from PT    Frequency    Min 5X/week      PT Plan Current plan remains appropriate    Co-evaluation              AM-PAC PT "6 Clicks" Daily Activity  Outcome Measure  Difficulty turning over in bed (including adjusting bedclothes, sheets and blankets)?: None Difficulty moving from lying on back to sitting on the side of the bed? : Unable Difficulty sitting down on and standing up from a chair with arms (e.g., wheelchair, bedside commode, etc,.)?: None Help needed moving to and from a bed to chair (including a wheelchair)?: None Help needed walking in hospital room?: A Little Help needed climbing 3-5 steps with a railing? : A Little 6 Click Score: 19    End of Session Equipment Utilized During Treatment: Gait belt;Back brace Activity Tolerance: Patient tolerated treatment well Patient left: in bed;with call bell/phone within reach Nurse Communication: Mobility status;Other (comment)(Pt mod indep with amb) PT Visit Diagnosis: Difficulty in walking, not elsewhere classified (R26.2);Pain     Time: 5638-7564 PT Time Calculation (min) (ACUTE ONLY): 39 min  Charges:  $Gait Training: 8-22 mins $Therapeutic Activity: 8-22 mins $Self Care/Home Management: 8-22                    G Codes:      Mabeline Caras, PT, DPT Acute Rehab Services  Pager: [place] 06/10/2017, 9:31 AM

## 2017-06-10 NOTE — Progress Notes (Signed)
Patient ID: Thomas Berger, male   DOB: [birth year], 72 y.o.   MRN: [id number] Subjective: Patient reports some nausea, difficulty with bowel movement, pain comes and goes. no leg pain  Objective: Vital signs in last 24 hours: Temp:  [97.9 F (36.6 C)-98.7 F (37.1 C)] 98.3 F (36.8 C) (05/12 0407) Pulse Rate:  [64-83] 80 (05/12 0407) Resp:  [16-17] 16 (05/12 0405) BP: (122-148)/(64-77) 146/77 (05/12 0407) SpO2:  [95 %-99 %] 96 % (05/12 0407)  Intake/Output from previous day: 05/11 0701 - 05/12 0700 In: 420 [P.O.:420] Out: -  Intake/Output this shift: No intake/output data recorded.  Neurologic: Grossly normal  Lab Results: Lab Results  Component Value Date   WBC 12.2 (H) 06/09/2017   HGB 12.1 (L) 06/09/2017   HCT 36.2 (L) 06/09/2017   MCV 89.8 06/09/2017   PLT 197 06/09/2017   No results found for: INR, PROTIME BMET Lab Results  Component Value Date   NA 135 06/09/2017   K 4.9 06/09/2017   CL 100 (L) 06/09/2017   CO2 26 06/09/2017   GLUCOSE 128 (H) 06/09/2017   BUN 18 06/09/2017   CREATININE 1.09 06/09/2017   CALCIUM 8.8 (L) 06/09/2017    Studies/Results: Dg Lumbar Spine 2-3 Views  Result Date: 06/08/2017 CLINICAL DATA:  L2 through L5 hardware fusion. EXAM: DG C-ARM 61-120 MIN; LUMBAR SPINE - 2-3 VIEW COMPARISON:  Lumbar spine MR dated 05/02/2017. FINDINGS: Five C-arm views of the lumbar spine demonstrate interval interbody and pedicle screw and rod fixation at the L2 through L5 levels with normal alignment. Mild anterior spur formation at multiple levels. IMPRESSION: L2 through L5 hardware fusion with normal alignment. Electronically Signed   By: Claudie Revering M.D.   On: 06/08/2017 18:10   Dg C-arm 1-60 Min  Result Date: 06/08/2017 CLINICAL DATA:  L2 through L5 hardware fusion. EXAM: DG C-ARM 61-120 MIN; LUMBAR SPINE - 2-3 VIEW COMPARISON:  Lumbar spine MR dated 05/02/2017. FINDINGS: Five C-arm views of the lumbar spine demonstrate interval interbody and pedicle  screw and rod fixation at the L2 through L5 levels with normal alignment. Mild anterior spur formation at multiple levels. IMPRESSION: L2 through L5 hardware fusion with normal alignment. Electronically Signed   By: Claudie Revering M.D.   On: 06/08/2017 18:10   Dg C-arm 1-60 Min  Result Date: 06/08/2017 CLINICAL DATA:  L2 through L5 hardware fusion. EXAM: DG C-ARM 61-120 MIN; LUMBAR SPINE - 2-3 VIEW COMPARISON:  Lumbar spine MR dated 05/02/2017. FINDINGS: Five C-arm views of the lumbar spine demonstrate interval interbody and pedicle screw and rod fixation at the L2 through L5 levels with normal alignment. Mild anterior spur formation at multiple levels. IMPRESSION: L2 through L5 hardware fusion with normal alignment. Electronically Signed   By: Claudie Revering M.D.   On: 06/08/2017 18:10   Dg C-arm 1-60 Min  Result Date: 06/08/2017 CLINICAL DATA:  L2 through L5 hardware fusion. EXAM: DG C-ARM 61-120 MIN; LUMBAR SPINE - 2-3 VIEW COMPARISON:  Lumbar spine MR dated 05/02/2017. FINDINGS: Five C-arm views of the lumbar spine demonstrate interval interbody and pedicle screw and rod fixation at the L2 through L5 levels with normal alignment. Mild anterior spur formation at multiple levels. IMPRESSION: L2 through L5 hardware fusion with normal alignment. Electronically Signed   By: Claudie Revering M.D.   On: 06/08/2017 18:10    Assessment/Plan: Trial of mag citrate today. Continue pain control. Hopefully home tomorrow  Estimated body mass index is 31.63 kg/m as calculated from the following:  Height as of this encounter: 5\' 8"  (1.727 m).   Weight as of this encounter: 94.3 kg (208 lb).    LOS: 2 days    Logyn Dedominicis S 06/10/2017, 8:25 AM

## 2017-06-11 ENCOUNTER — Encounter (HOSPITAL_COMMUNITY): Payer: Self-pay | Admitting: Neurosurgery

## 2017-06-11 MED ORDER — OXYCODONE HCL 10 MG PO TABS: 5.0000 mg | ORAL_TABLET | ORAL | 0 refills | Status: DC | PRN

## 2017-06-11 MED ORDER — METHOCARBAMOL 500 MG PO TABS: 500.0000 mg | ORAL_TABLET | Freq: Four times a day (QID) | ORAL | 1 refills | Status: DC | PRN

## 2017-06-11 NOTE — Progress Notes (Signed)
Overnight pt ambulated 2 times in hallway 500 ft each time using a walker without difficulty. Pain controlled with Tylenol, Robaxin, and Oxycodone. Pt had 1 bottle of Magnesium Citrate for constipation, and had a BM and is also voiding. Pt is eathing and drinking. Honeycomb dressings are clean, dry, intact. Will continue to monitor.

## 2017-06-11 NOTE — Discharge Summary (Signed)
Physician Discharge Summary  Patient ID: Thomas Berger MRN: [id number] DOB/AGE: 71/29/1947 71 y.o.  Admit date: 06/08/2017 Discharge date: 06/11/2017  Admission Diagnoses: SPINAL STENOSIS OF LUMBAR REGION WITH NEUROGENIC CLAUDICATION, lumbar spondylolisthesis, sagittal plane imbalance, lumbago, radiculopathy    Discharge Diagnoses: SPINAL STENOSIS OF LUMBAR REGION WITH NEUROGENIC CLAUDICATION, lumbar spondylolisthesis, sagittal plane imbalance, lumbago, radiculopathy s/p LEFT LUMBAR 2- LUMBAR 3, LUMBAR 3- LUMBAR 4, LUMBAR 4- LUMBAR 5 ANTERIOR LATERAL LUMBAR INTERBODY FUSION (Left) - LEFT LUMBAR 2- LUMBAR 3, LUMBAR 3- LUMBAR 4, LUMBAR 4- LUMBAR 5 ANTERIOR LATERAL LUMBAR INTERBODY FUSION LUMBAR PERCUTANEOUS PEDICLE SCREW 3 LEVEL (Left) - LUMBAR PERCUTANEOUS PEDICLE SCREW 3 LEVEL L 2 - L 5 bilaterally   Active Problems:   Spondylolisthesis of lumbar region   Discharged Condition: good  Hospital Course: Clause Januszewski was admitted for surgery with dx lumbar stenosis and radiculopathy. Following uncomplicated XLIF and pedicle screw fixation L2-5, he recovered well and transferred to 3W for nursing care and therapies. He mobilized well.   Consults: None  Significant Diagnostic Studies: radiology: X-Ray: intra-op  Treatments: surgery: LEFT LUMBAR 2- LUMBAR 3, LUMBAR 3- LUMBAR 4, LUMBAR 4- LUMBAR 5 ANTERIOR LATERAL LUMBAR INTERBODY FUSION (Left) - LEFT LUMBAR 2- LUMBAR 3, LUMBAR 3- LUMBAR 4, LUMBAR 4- LUMBAR 5 ANTERIOR LATERAL LUMBAR INTERBODY FUSION LUMBAR PERCUTANEOUS PEDICLE SCREW 3 LEVEL (Left) - LUMBAR PERCUTANEOUS PEDICLE SCREW 3 LEVEL L 2 - L 5 bilaterally    Discharge Exam: Blood pressure 138/70, pulse 80, temperature 99.9 F (37.7 C), temperature source Oral, resp. rate 18, height 5\' 8"  (1.727 m), weight 94.3 kg (208 lb), SpO2 97 %. Alert, conversant. Reports lumbar and abdominal soreness with postion changes, but denies leg pain or numbness. Incisions flat  without erythema, swelling, or drainage beneath honeycomb and Dermabond lefgt side and low back. Good strength BLE. BM last night.    Disposition:  Discharge to home. Rx's for Oxycodone 5mg  1-2 po q 6hrs prn pain and Robaxin 500mg  q8hrs prn spasm will be sent to pt's pharmacy electronically from office. Pt verbalizes understanding of d/c instructions and agrees to call office to schedule f/u appt.      Allergies as of 06/11/2017      Reactions   Penicillins Hives, Other (See Comments)   Has patient had a PCN reaction causing immediate rash, facial/tongue/throat swelling, SOB or lightheadedness with hypotension: No Has patient had a PCN reaction causing SEVERE rash involving mucus membranes or skin necrosis: #  #  #  YES  #  #  #  Has patient had a PCN reaction that required hospitalization: No Has patient had a PCN reaction occurring within the last 10 years: No If all of the above answers are "NO", then may proceed with Cephalosporin use.      Medication List    STOP taking these medications   naproxen sodium 220 MG tablet Commonly known as:  ALEVE     TAKE these medications   acetaminophen 500 MG tablet Commonly known as:  TYLENOL Take 500 mg by mouth daily as needed for moderate pain or headache.   aspirin EC 81 MG tablet Take 81 mg by mouth daily.   AYR SALINE NASAL RINSE NA Place 1 Dose into the nose daily as needed (congestion).   BETA CAROTENE PO Take 1 tablet by mouth daily.   CRANBERRY PLUS VITAMIN C PO Take 1 tablet by mouth daily.   diphenhydrAMINE 25 MG tablet Commonly known as:  BENADRYL Take 25 mg by mouth at bedtime.  losartan 100 MG tablet Commonly known as:  COZAAR Take 100 mg by mouth daily.   Melatonin 5 MG Tabs Take 5 mg by mouth at bedtime.   methocarbamol 500 MG tablet Commonly known as:  ROBAXIN Take 1 tablet (500 mg total) by mouth every 6 (six) hours as needed for muscle spasms.   Oxycodone HCl 10 MG Tabs Take 0.5-1 tablets  (5-10 mg total) by mouth every 4 (four) hours as needed for severe pain ((score 7 to 10)).   simvastatin 20 MG tablet Commonly known as:  ZOCOR Take 20 mg by mouth at bedtime.            Durable Medical Equipment  (From admission, onward)        Start     Ordered   06/11/17 0747  For home use only DME Walker rolling  Once    Question:  Patient needs a walker to treat with the following condition  Answer:  Neurogenic claudication   06/11/17 0749       Signed: Peggyann Shoals, MD 06/11/2017, 8:30 AM

## 2017-06-11 NOTE — Addendum Note (Signed)
Addendum  created 06/11/17 0904 by Ollen Bowl, CRNA   Intraprocedure Staff edited

## 2017-06-11 NOTE — Care Management Note (Signed)
Case Management Note  Patient Details  Name: Thomas Berger MRN: [id number] Date of Birth: 1945-02-08  Subjective/Objective:    Pt s/p lumbar fusion. He is from home with his spouse.               Action/Plan: Pt discharging home with self care. Pt with orders for rolling walker. Per patient and wife, pt has this equipment at home. Wife to provided intermittent supervision at home and transportation home.   Expected Discharge Date:  06/11/17               Expected Discharge Plan:  Home/Self Care  In-House Referral:     Discharge planning Services  CM Consult  Post Acute Care Choice:  Durable Medical Equipment Choice offered to:  Patient  DME Arranged:  [facility] Rile rolling(pt already had at home) DME Agency:     HH Arranged:    [facility] Agency:     Status of Service:  Completed, signed off  If discussed at [employer], dates discussed:    Additional Comments:  Pollie Friar, RN 06/11/2017, 9:21 AM

## 2017-06-11 NOTE — Progress Notes (Addendum)
Subjective: Patient reports "I'm doing better today. The numbness is gone"  Objective: Vital signs in last 24 hours: Temp:  [98.3 F (36.8 C)-99.9 F (37.7 C)] 99.9 F (37.7 C) (05/13 0431) Pulse Rate:  [80-87] 80 (05/13 0431) Resp:  [16-18] 18 (05/13 0431) BP: (133-151)/(63-73) 138/70 (05/13 0431) SpO2:  [96 %-98 %] 97 % (05/13 0431)  Intake/Output from previous day: No intake/output data recorded. Intake/Output this shift: No intake/output data recorded.  Alert, conversant. Reports lumbar and abdominal soreness with postion changes, but denies leg pain or numbness. Incisions flat without erythema, swelling, or drainage beneath honeycomb and Dermabond lefgt side and low back. Good strength BLE. BM last night.   Lab Results: Recent Labs    06/09/17 0856  WBC 12.2*  HGB 12.1*  HCT 36.2*  PLT 197   BMET Recent Labs    06/09/17 0856  NA 135  K 4.9  CL 100*  CO2 26  GLUCOSE 128*  BUN 18  CREATININE 1.09  CALCIUM 8.8*    Studies/Results: No results found.  Assessment/Plan: Improving  LOS: 3 days  Per Dr. Vertell Limber, d/c to home. Rx's for Oxycodone 5mg  1-2 po q 6hrs prn pain and Robaxin 500mg  q8hrs prn spasm will be sent to pt's pharmacy electronically from office. Pt verbalizes understanding of d/c instructions and agrees to call office to schedule f/u appt.   Verdis Prime 06/11/2017, 7:38 AM  Patient is doing well.  Discharge home.

## 2017-06-11 NOTE — Plan of Care (Signed)
Independent with adl's

## 2017-06-13 MED FILL — Sodium Chloride IV Soln 0.9%: INTRAVENOUS | Qty: 1000 | Status: AC

## 2017-06-13 MED FILL — Heparin Sodium (Porcine) Inj 1000 Unit/ML: INTRAMUSCULAR | Qty: 30 | Status: AC

## 2018-04-08 ENCOUNTER — Other Ambulatory Visit: Payer: Self-pay | Admitting: Orthopedic Surgery

## 2018-04-08 DIAGNOSIS — M25561 Pain in right knee: Secondary | ICD-10-CM

## 2018-04-08 DIAGNOSIS — M1711 Unilateral primary osteoarthritis, right knee: Secondary | ICD-10-CM

## 2018-04-17 ENCOUNTER — Encounter (INDEPENDENT_AMBULATORY_CARE_PROVIDER_SITE_OTHER): Payer: Self-pay

## 2018-04-17 ENCOUNTER — Ambulatory Visit
Admission: RE | Admit: 2018-04-17 | Discharge: 2018-04-17 | Disposition: A | Discharge status: Home / Self Care | Payer: Medicare Other | Source: Ambulatory Visit | Attending: Orthopedic Surgery | Admitting: Orthopedic Surgery

## 2018-04-17 ENCOUNTER — Other Ambulatory Visit: Payer: Self-pay

## 2018-04-17 DIAGNOSIS — M25561 Pain in right knee: Secondary | ICD-10-CM

## 2018-04-17 DIAGNOSIS — M1711 Unilateral primary osteoarthritis, right knee: Secondary | ICD-10-CM

## 2019-03-22 ENCOUNTER — Ambulatory Visit: Payer: Medicare Other | Attending: Internal Medicine

## 2019-03-22 DIAGNOSIS — Z23 Encounter for immunization: Secondary | ICD-10-CM | POA: Insufficient documentation

## 2019-03-22 NOTE — Progress Notes (Signed)
   Covid-19 Vaccination Clinic  Name:  Thomas Berger    MRN: [id number] DOB: [birth year]  03/22/2019  Mr. Thomas Berger was observed post Covid-19 immunization for 15 minutes without incidence. He was provided with Vaccine Information Sheet and instruction to access the V-Safe system.   Mr. Thomas Berger was instructed to call 911 with any severe reactions post vaccine: Marland Kitchen Difficulty breathing  . Swelling of your face and throat  . A fast heartbeat  . A bad rash all over your body  . Dizziness and weakness    Immunizations Administered    Name Date Dose VIS Date Route   Pfizer COVID-19 Vaccine 03/22/2019 10:07 AM 0.3 mL 01/10/2019 Intramuscular   Manufacturer: [employer]   Lot: [id number]   [place]: [id number]

## 2019-04-15 ENCOUNTER — Ambulatory Visit: Payer: Medicare Other | Attending: Internal Medicine

## 2019-04-15 DIAGNOSIS — Z23 Encounter for immunization: Secondary | ICD-10-CM

## 2019-04-15 NOTE — Progress Notes (Signed)
   Covid-19 Vaccination Clinic  Name:  Thomas Berger    MRN: [id number] DOB: Dec 15, 1945  04/15/2019  Thomas Berger was observed post Covid-19 immunization for 15 minutes without incident. He was provided with Vaccine Information Sheet and instruction to access the V-Safe system.   Thomas Berger was instructed to call 911 with any severe reactions post vaccine: Marland Kitchen Difficulty breathing  . Swelling of face and throat  . A fast heartbeat  . A bad rash all over body  . Dizziness and weakness   Immunizations Administered    Name Date Dose VIS Date Route   Pfizer COVID-19 Vaccine 04/15/2019  9:51 AM 0.3 mL 01/10/2019 Intramuscular   Manufacturer: [place]   Lot: [id number]   [place]: [id number]

## 2020-08-19 LAB — SURGICAL PATHOLOGY

## 2020-09-13 IMAGING — MR MRI OF THE RIGHT KNEE WITHOUT CONTRAST
6 series · 40 of 40 positions shown · non-contrast
Comparison: None.

CLINICAL DATA: Right knee pain for 2 months.  No known injury.

EXAM:
MRI OF THE RIGHT KNEE WITHOUT CONTRAST
TECHNIQUE: Multiplanar, multisequence MR imaging of the knee was performed. No
intravenous contrast was administered.

[Series 3: T2 fat-sat · axial · 4.0mm · 0.53mm/px · z∈[-84,+86]mm · 8 of 35 slices shown (1 of 3)]
[im 1/35]
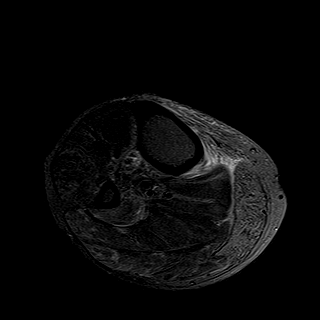
[im 5/35]
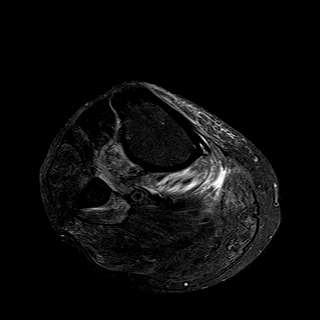
[im 10/35]
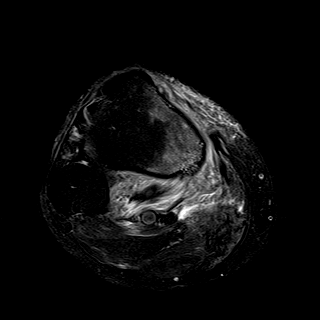
[im 15/35]
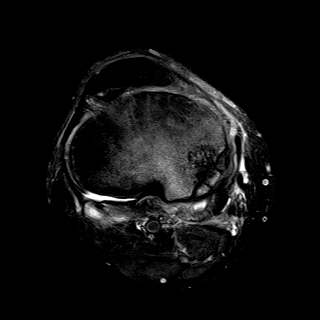
[im 20/35]
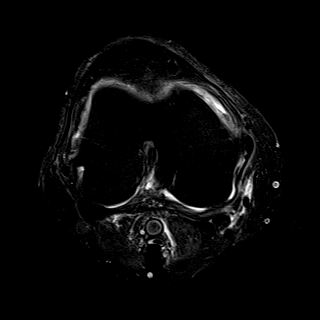
[im 25/35]
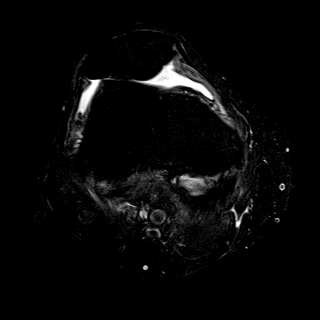
[im 30/35]
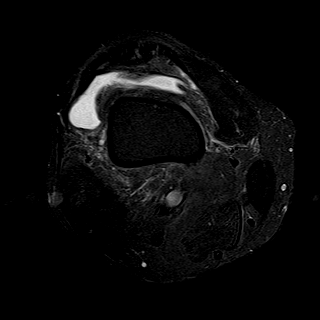
[im 35/35]
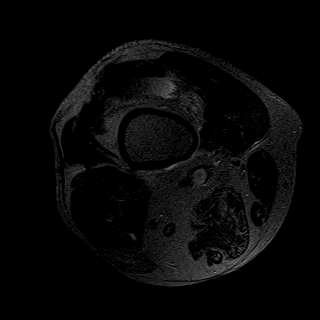

[Series 4: T1 · coronal · 4.0mm · 0.62mm/px · 6 of 31 slices shown]
[im 1/31]
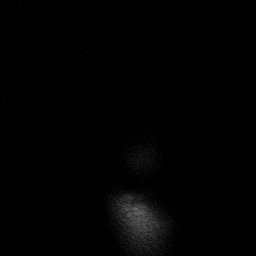
[im 7/31]
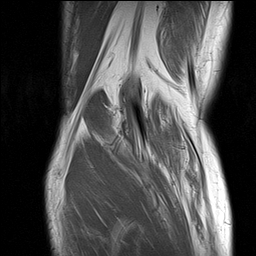
[im 13/31]
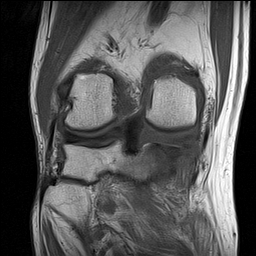
[im 19/31]
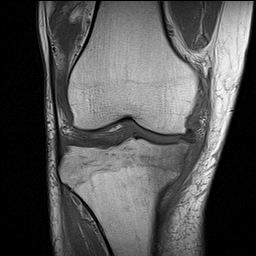
[im 25/31]
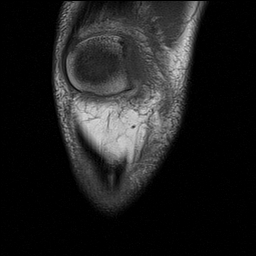
[im 31/31]
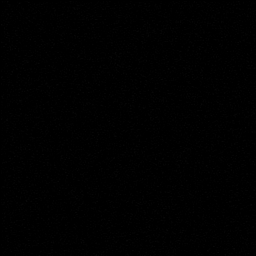

[Series 5: T2 fat-sat · coronal · 4.0mm · 0.62mm/px · 6 of 31 slices shown (2 of 3)]
[im 1/31]
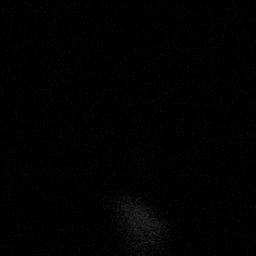
[im 7/31]
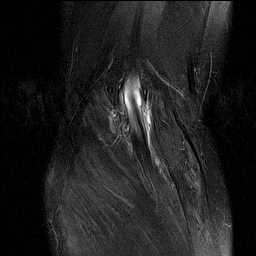
[im 13/31]
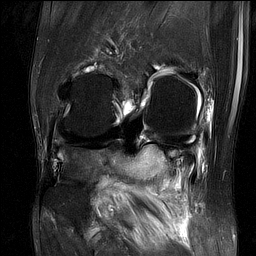
[im 19/31]
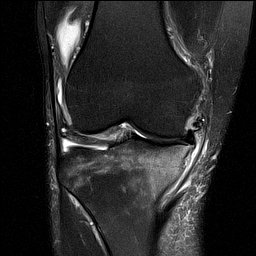
[im 25/31]
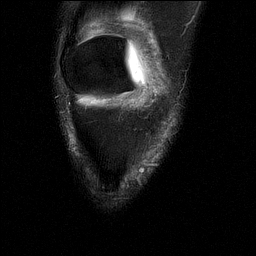
[im 31/31]
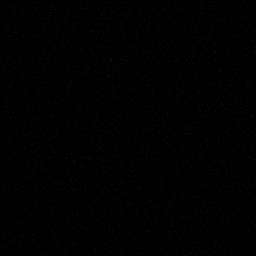

[Series 6: PD fat-sat · coronal · 4.0mm · 0.62mm/px · 6 of 31 slices shown (1 of 2)]
[im 1/31]
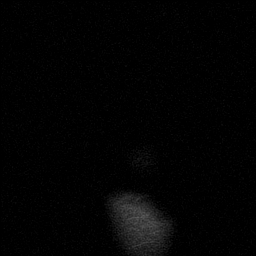
[im 7/31]
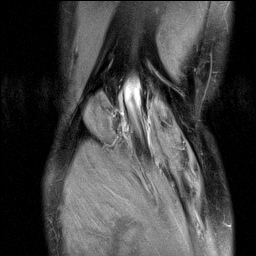
[im 13/31]
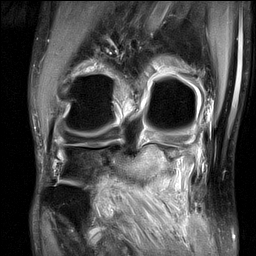
[im 19/31]
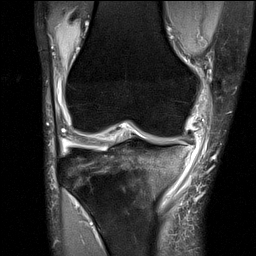
[im 25/31]
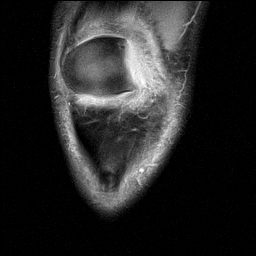
[im 31/31]
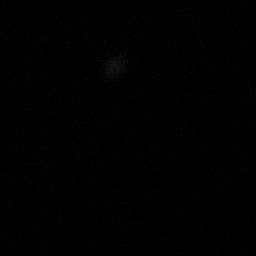

[Series 7: PD fat-sat · sagittal · 3.0mm · 0.62mm/px · 7 of 33 slices shown (2 of 2)]
[im 1/33]
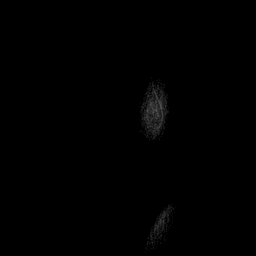
[im 6/33]
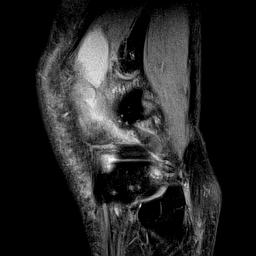
[im 11/33]
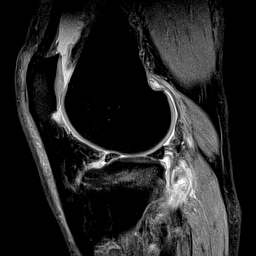
[im 17/33]
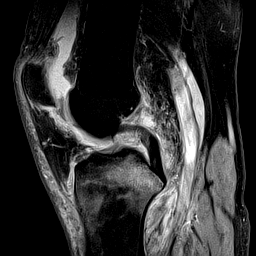
[im 22/33]
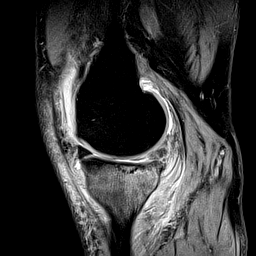
[im 27/33]
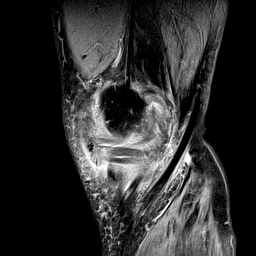
[im 33/33]
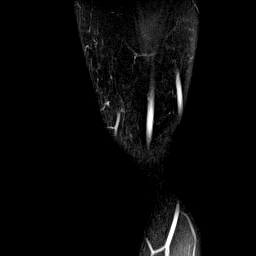

[Series 8: T2 fat-sat · sagittal · 3.0mm · 0.62mm/px · 7 of 33 slices shown (3 of 3)]
[im 1/33]
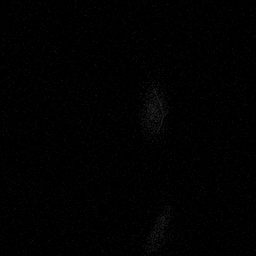
[im 6/33]
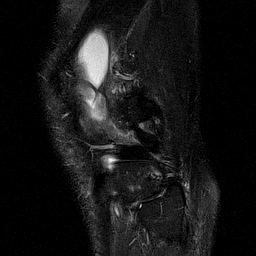
[im 11/33]
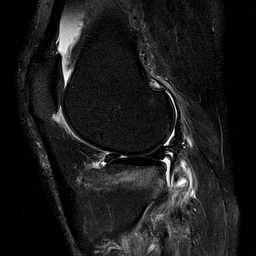
[im 17/33]
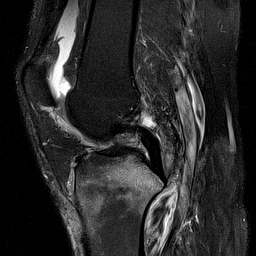
[im 22/33]
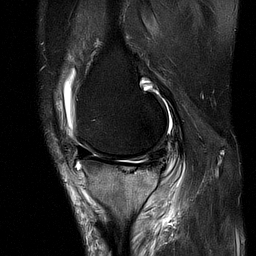
[im 27/33]
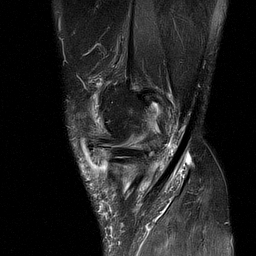
[im 33/33]
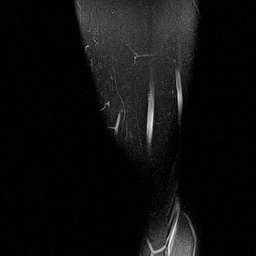

[40 of 40 positions shown; findings below may reference images not displayed]

FINDINGS: MENISCI

Medial meniscus: Extensive complex tearing is present throughout the
posterior horn and body without displaced fragment.

Lateral meniscus:  Intact.

LIGAMENTS

Cruciates:  Intact.

Collaterals:  Intact.

CARTILAGE

Patellofemoral:  Mildly degenerated.

Medial:  Moderately degenerated.

Lateral:  Preserved.

Joint:  Moderate joint effusion.

Popliteal Fossa: No Baker's cyst. There is fluid about the pes
anserine tendons. Edema is seen in the visualized popliteus muscle.

Extensor Mechanism:  Intact.

Bones: The patient has a fracture of the posterior aspect of the
tibial plateau. Depression is up to 0.2 cm and there is extensive
associated marrow edema.

Other: None.
IMPRESSION: Acute mildly depressed fracture of the posterior aspect of the
medial tibial plateau is presumably an insufficiency injury as no
history of trauma is given.

Extensive complex tearing posterior horn and posterior body of the
medial meniscus.

Edema in the popliteus muscle belly likely due to strain. The tendon
is intact.

Fluid about the pes anserine tendons compatible with bursitis.

Mild osteoarthritis about the knee.

These results will be called to the ordering clinician or
representative by the Radiologist Assistant, and communication
documented in the PACS or zVision Dashboard.

## 2021-05-11 ENCOUNTER — Encounter: Payer: Self-pay | Admitting: Gastroenterology

## 2021-05-11 NOTE — H&P (Signed)
? ?Pre-Procedure H&P ?  ?Patient ID: Thomas Berger is a 76 y.o. male. ? ?Gastroenterology Provider: Annamaria Helling, DO ? ?Referring Provider: Laurine Blazer, PA ?PCP: Dion Body, MD ? ?Date: 05/12/2021 ? ?HPI ?Mr. Thomas Berger is a 76 y.o. male who presents today for Colonoscopy for Screening colonoscopy. 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  He did not require budesonide at that time as he changed his water filtration system as it was demonstrated to be contaminated.  Since the change in filtration his bowel movements have improved with 1 formed per day without melena or hematochezia.  Pandiverticulosis was also noted at that point in time. ?He is on simvastatin. ? ?He denies any abdominal pain.  Rare NSAID use and no tobacco use.  1 hyperplastic polyp in the rectum. ?No family history of colorectal cancer or polyps.  Brother with history of hepatitis C ?Most recent lab values creatinine 1.0 hemoglobin 13.8 platelets 220,000 ? ?Past Medical History:  ?Diagnosis Date  ? Arthritis   ? Basal cell carcinoma   ? nose, temple, lt.arm  ? HTN (hypertension)   ? Hyperlipidemia   ? Skin cancer   ? Spinal stenosis of lumbar region with neurogenic claudication   ? ? ?Past Surgical History:  ?Procedure Laterality Date  ? ANKLE FRACTURE SURGERY    ? Right  ? ANTERIOR LAT LUMBAR FUSION Left 06/08/2017  ? Procedure: LEFT LUMBAR 2- LUMBAR 3, LUMBAR 3- LUMBAR 4, LUMBAR 4- LUMBAR 5 ANTERIOR LATERAL LUMBAR INTERBODY FUSION;  Surgeon: Erline Levine, MD;  Location: [facility];  Service: Neurosurgery;  Laterality: Left;  LEFT LUMBAR 2- LUMBAR 3, LUMBAR 3- LUMBAR 4, LUMBAR 4- LUMBAR 5 ANTERIOR LATERAL LUMBAR INTERBODY FUSION  ? CERVICAL SPINE SURGERY    ? COLONOSCOPY WITH PROPOFOL N/A 11/08/2015  ? Procedure: COLONOSCOPY WITH PROPOFOL;  Surgeon: Lollie Sails, MD;  Location: [facility] ENDOSCOPY;  Service: Endoscopy;  Laterality: N/A;  ? EYE SURGERY    ? Bilateral cataracts   ? LUMBAR PERCUTANEOUS PEDICLE SCREW 3 LEVEL Left 06/08/2017  ? Procedure: LUMBAR PERCUTANEOUS PEDICLE SCREW 3 LEVEL;  Surgeon: Erline Levine, MD;  Location: [facility];  Service: Neurosurgery;  Laterality: Left;  LUMBAR PERCUTANEOUS PEDICLE SCREW 3 LEVEL  ? ? ?Family History ?Brother-hepatitis C ?No other h/o GI disease or malignancy ? ? ?Review of Systems  ?Constitutional:  Negative for activity change, appetite change, chills, diaphoresis, fatigue, fever and unexpected weight change.  ?HENT:  Negative for trouble swallowing and voice change.   ?Respiratory:  Negative for shortness of breath and wheezing.   ?Cardiovascular:  Negative for chest pain, palpitations and leg swelling.  ?Gastrointestinal:  Negative for abdominal distention, abdominal pain, anal bleeding, blood in stool, constipation, diarrhea, nausea and vomiting.  ?Musculoskeletal:  Negative for arthralgias and myalgias.  ?Skin:  Negative for color change and pallor.  ?Neurological:  Negative for dizziness, syncope and weakness.  ?Psychiatric/Behavioral:  Negative for confusion. The patient is not nervous/anxious.   ?All other systems reviewed and are negative.  ? ?Medications ?No current facility-administered medications on file prior to encounter.  ? ?Current Outpatient Medications on File Prior to Encounter  ?Medication Sig Dispense Refill  ? aspirin EC 81 MG tablet Take 81 mg by mouth daily.    ? BETA CAROTENE PO Take 1 tablet by mouth daily.    ? Cranberry-Vitamin C-Vitamin E (CRANBERRY PLUS VITAMIN C PO) Take 1 tablet by mouth daily.    ? hydrochlorothiazide (HYDRODIURIL) 12.5 MG tablet  Take 12.5 mg by mouth daily.    ? losartan (COZAAR) 100 MG tablet Take 100 mg by mouth daily.    ? sildenafil (VIAGRA) 50 MG tablet Take 50 mg by mouth daily as needed for erectile dysfunction.    ? simvastatin (ZOCOR) 20 MG tablet Take 20 mg by mouth at bedtime.    ? acetaminophen (TYLENOL) 500 MG tablet Take 500 mg by mouth daily as needed for moderate pain or  headache.    ? diphenhydrAMINE (BENADRYL) 25 MG tablet Take 25 mg by mouth at bedtime.     ? Melatonin 5 MG TABS Take 5 mg by mouth at bedtime.    ? methocarbamol (ROBAXIN) 500 MG tablet Take 1 tablet (500 mg total) by mouth every 6 (six) hours as needed for muscle spasms. 60 tablet 1  ? oxyCODONE 10 MG TABS Take 0.5-1 tablets (5-10 mg total) by mouth every 4 (four) hours as needed for severe pain ((score 7 to 10)). 60 tablet 0  ? Sodium Chloride-Sodium Bicarb (AYR SALINE NASAL RINSE NA) Place 1 Dose into the nose daily as needed (congestion).    ? ? ?Pertinent medications related to GI and procedure were reviewed by me with the patient prior to the procedure ? ? ?Current Facility-Administered Medications:  ?  0.9 %  sodium chloride infusion, , Intravenous, Continuous, Annamaria Helling, DO, Last Rate: 20 mL/hr at 05/12/21 0804, New Bag at 05/12/21 0804 ?  ?  ? ?Allergies  ?Allergen Reactions  ? Penicillins Hives and Other (See Comments)  ?  Has patient had a PCN reaction causing immediate rash, facial/tongue/throat swelling, SOB or lightheadedness with hypotension: No ?Has patient had a PCN reaction causing SEVERE rash involving mucus membranes or skin necrosis: #  #  #  YES  #  #  #  ?Has patient had a PCN reaction that required hospitalization: No ?Has patient had a PCN reaction occurring within the last 10 years: No ?If all of the above answers are "NO", then may proceed with Cephalosporin use. ?  ? ?Allergies were reviewed by me prior to the procedure ? ?Objective  ? ?Body mass index is 30.41 kg/m?. ?Vitals:  ? 05/12/21 0751  ?BP: (!) 147/67  ?Pulse: 76  ?Resp: 17  ?Temp: (!) 96.3 ?F (35.7 ?C)  ?TempSrc: Temporal  ?SpO2: 97%  ?Weight: 90.7 kg  ?Height: '5\' 8"'$  (1.727 m)  ? ? ? ?Physical Exam ?Vitals and nursing note reviewed.  ?Constitutional:   ?   General: He is not in acute distress. ?   Appearance: Normal appearance. He is not ill-appearing, toxic-appearing or diaphoretic.  ?HENT:  ?   Head: Normocephalic  and atraumatic.  ?   Nose: Nose normal.  ?   Mouth/Throat:  ?   Mouth: Mucous membranes are moist.  ?   Pharynx: Oropharynx is clear.  ?Eyes:  ?   General: No scleral icterus. ?   Extraocular Movements: Extraocular movements intact.  ?Cardiovascular:  ?   Rate and Rhythm: Normal rate and regular rhythm.  ?   Heart sounds: Normal heart sounds. No murmur heard. ?  No friction rub. No gallop.  ?Pulmonary:  ?   Effort: Pulmonary effort is normal. No respiratory distress.  ?   Breath sounds: Normal breath sounds. No wheezing, rhonchi or rales.  ?Abdominal:  ?   General: Bowel sounds are normal. There is no distension.  ?   Palpations: Abdomen is soft.  ?   Tenderness: There is no abdominal tenderness.  There is no guarding or rebound.  ?Musculoskeletal:  ?   Cervical back: Neck supple.  ?   Right lower leg: No edema.  ?   Left lower leg: No edema.  ?Skin: ?   General: Skin is warm and dry.  ?   Coloration: Skin is not jaundiced or pale.  ?Neurological:  ?   General: No focal deficit present.  ?   Mental Status: He is alert and oriented to person, place, and time. Mental status is at baseline.  ?Psychiatric:     ?   Mood and Affect: Mood normal.     ?   Behavior: Behavior normal.     ?   Thought Content: Thought content normal.     ?   Judgment: Judgment normal.  ? ? ? ?Assessment:  ?Mr. Thomas Berger is a 76 y.o. male  who presents today for Colonoscopy for Screening colonoscopy. ? ?Plan:  ?Colonoscopy with possible intervention today ? ?Colonoscopy with possible biopsy, control of bleeding, polypectomy, and interventions as necessary has been discussed with the patient/patient representative. Informed consent was obtained from the patient/patient representative after explaining the indication, nature, and risks of the procedure including but not limited to death, bleeding, perforation, missed neoplasm/lesions, cardiorespiratory compromise, and reaction to medications. Opportunity for questions was given and  appropriate answers were provided. Patient/patient representative has verbalized understanding is amenable to undergoing the procedure. ? ? ?Annamaria Helling, DO  ?[facility] ? ?Portions

## 2021-05-12 ENCOUNTER — Ambulatory Visit
Admission: RE | Admit: 2021-05-12 | Discharge: 2021-05-12 | Disposition: A | Discharge status: Home / Self Care | Payer: Medicare Other | Source: Ambulatory Visit | Attending: Gastroenterology | Admitting: Gastroenterology

## 2021-05-12 ENCOUNTER — Encounter
Admission: RE | Disposition: A | Discharge status: Home / Self Care | Payer: Self-pay | Source: Ambulatory Visit | Attending: Gastroenterology

## 2021-05-12 ENCOUNTER — Ambulatory Visit: Payer: Medicare Other | Admitting: Certified Registered Nurse Anesthetist

## 2021-05-12 ENCOUNTER — Encounter: Payer: Self-pay | Admitting: Gastroenterology

## 2021-05-12 DIAGNOSIS — K52832 Lymphocytic colitis: Secondary | ICD-10-CM | POA: Diagnosis not present

## 2021-05-12 DIAGNOSIS — K640 First degree hemorrhoids: Secondary | ICD-10-CM | POA: Insufficient documentation

## 2021-05-12 DIAGNOSIS — D122 Benign neoplasm of ascending colon: Secondary | ICD-10-CM | POA: Diagnosis not present

## 2021-05-12 DIAGNOSIS — K5730 Diverticulosis of large intestine without perforation or abscess without bleeding: Secondary | ICD-10-CM | POA: Insufficient documentation

## 2021-05-12 DIAGNOSIS — I10 Essential (primary) hypertension: Secondary | ICD-10-CM | POA: Insufficient documentation

## 2021-05-12 DIAGNOSIS — Z09 Encounter for follow-up examination after completed treatment for conditions other than malignant neoplasm: Secondary | ICD-10-CM | POA: Diagnosis not present

## 2021-05-12 DIAGNOSIS — Z79899 Other long term (current) drug therapy: Secondary | ICD-10-CM | POA: Insufficient documentation

## 2021-05-12 HISTORY — PX: COLONOSCOPY WITH PROPOFOL: SHX5780

## 2021-05-12 SURGERY — COLONOSCOPY WITH PROPOFOL
Anesthesia: General

## 2021-05-12 MED ORDER — PROPOFOL 500 MG/50ML IV EMUL
INTRAVENOUS | Status: DC | PRN
  Administered 2021-05-12: 160 ug/kg/min via INTRAVENOUS

## 2021-05-12 MED ORDER — PROPOFOL 10 MG/ML IV BOLUS
INTRAVENOUS | Status: DC | PRN
  Administered 2021-05-12: 70 mg via INTRAVENOUS

## 2021-05-12 MED ORDER — SODIUM CHLORIDE 0.9 % IV SOLN: INTRAVENOUS | Status: DC

## 2021-05-12 NOTE — Op Note (Addendum)
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 Estimated blood loss:  ?                       Minimal. ?Procedure:             Pre-Anesthesia Assessment: ?                       - Prior to the procedure, a History and Physical was  ?                       performed, and patient medications and allergies were  ?                       reviewed. The patient is competent. The risks and  ?                       benefits of the procedure and the sedation options and  ?                       risks were discussed with the patient. All questions  ?                       were answered and informed consent was obtained.  ?                       Patient identification and proposed procedure were  ?                       verified by the physician, the nurse, the anesthetist  ?                       and the technician in the endoscopy suite. Mental  ?                       Status Examination: alert and oriented. Airway  ?                       Examination: normal oropharyngeal airway and neck  ?                       mobility. Respiratory Examination: clear to  ?                       auscultation. CV Examination: RRR, no murmurs, no S3  ?  or S4. Prophylactic Antibiotics: The patient does not  ?                       require prophylactic antibiotics. Prior  ?                       Anticoagulants: The patient has taken no previous  ?                        anticoagulant or antiplatelet agents. ASA Grade  ?                       Assessment: II - A patient with mild systemic disease.  ?                       After reviewing the risks and benefits, the patient  ?                       was deemed in satisfactory condition to undergo the  ?                       procedure. The anesthesia plan was to use monitored  ?                       anesthesia care (MAC). Immediately prior to  ?                       administration of medications, the patient was  ?                       re-assessed for adequacy to receive sedatives. The  ?                       heart rate, respiratory rate, oxygen saturations,  ?                       blood pressure, adequacy of pulmonary ventilation, and  ?                       response to care were monitored throughout the  ?                       procedure. The physical status of the patient was  ?                       re-assessed after the procedure. ?                       After obtaining informed consent, the colonoscope was  ?                       passed under direct vision. Throughout the procedure,  ?                       the patient's blood pressure, pulse, and oxygen  ?                       saturations were monitored continuously. The  ?  Colonoscope was introduced through the anus and  ?                       advanced to the the terminal ileum, with  ?                       identification of the appendiceal orifice and IC  ?                       valve. The colonoscopy was performed without  ?                       difficulty. The patient tolerated the procedure well.  ?                       The quality of the bowel preparation was evaluated  ?                       using the BBPS [facility] Bowel Preparation Scale) with  ?                       scores of: Right Colon = 2 (minor amount of residual  ?                       staining, small fragments of stool and/or opaque  ?                        liquid, but mucosa seen well), Transverse Colon = 3  ?                       (entire mucosa seen well with no residual staining,  ?                       small fragments of stool or opaque liquid) and Left  ?                       Colon = 3 (entire mucosa seen well with no residual  ?                       staining, small fragments of stool or opaque liquid).  ?                       The total BBPS score equals 8. The quality of the  ?                       bowel preparation was excellent. The terminal ileum,  ?                       ileocecal valve, appendiceal orifice, and rectum were  ?                       photographed. ?Findings: ?     The perianal and digital rectal examinations were normal. Pertinent  ?     negatives include normal sphincter tone. ?     The terminal ileum appeared normal. Estimated blood loss: none. ?     Multiple small-mouthed diverticula were found in the entire colon.  ?  Estimated blood loss: none. ?     Non-bleeding internal hemorrhoids were found during retroflexion. The  ?     hemorrhoids were Grade I (internal hemorrhoids that do not prolapse).  ?     Estimated blood loss: none. ?     A 1 to 2 mm polyp was found in the ascending colon. The polyp was  ?     sessile. The polyp was removed with a cold biopsy forceps. Resection and  ?     retrieval were complete. Estimated blood loss was minimal. ?     Normal mucosa was found in the entire colon. Biopsies for histology were  ?     taken with a cold forceps from the right colon and left colon for  ?     evaluation of microscopic colitis. Estimated blood loss was minimal. ?     The exam was otherwise without abnormality on direct and retroflexion  ?     views. ?Impression:            - The examined portion of the ileum was normal. ?                       - Diverticulosis in the entire examined colon. ?                       - Non-bleeding internal hemorrhoids. ?                       - One 1 to 2 mm polyp in the ascending colon, removed   ?                       with a cold biopsy forceps. Resected and retrieved. ?                       - Normal mucosa in the entire examined colon. Biopsied. ?                       - The examination was otherwise normal on direct and  ?                       retroflexion views. ?Recommendation:        - Discharge patient to home. ?                       - Resume previous diet. ?                       - Continue present medications. ?                       - No aspirin, ibuprofen, naproxen, or other  ?                       non-steroidal anti-inflammatory drugs for 5 days after  ?                       polyp removal. ?                       - Await pathology results. ?                       -  Repeat colonoscopy for surveillance based on  ?                       pathology results. ?                       - Return to referring physician as previously  ?                       scheduled. ?                       - The findings and recommendations were discussed with  ?                       the patient. ?Procedure Code(s):     --- Professional --- ?                       [phone], Colonoscopy, flexible; with biopsy, single or  ?                       multiple ?Diagnosis Code(s):     --- Professional --- ?                       Z12.11, Encounter for screening for malignant neoplasm  ?                       of colon ?                       K64.0, First degree hemorrhoids ?                       K63.5, Polyp of colon ?                       K57.30, Diverticulosis of large intestine without  ?                       perforation or abscess without bleeding ?CPT copyright 2019 American Medical Association. All rights reserved. ?The codes documented in this report are preliminary and upon coder review may  ?be revised to meet current compliance requirements. ?Attending Participation: ?     I personally performed the entire procedure. ?Volney American, DO ?Annamaria Helling DO, DO ?05/12/2021 8:51:50 AM ?This report has been signed  electronically. ?Number of Addenda: 0 ?Note Initiated On: 05/12/2021 8:20 AM ?Scope Withdrawal Time: 0 hours 17 minutes 50 seconds  ?Total Procedure Duration: 0 hours 23 minutes 35 seconds  ?Estimated Blood

## 2021-05-12 NOTE — Anesthesia Preprocedure Evaluation (Signed)
Anesthesia Evaluation  ?Patient identified by MRN, date of birth, ID band ?Patient awake ? ? ? ?Reviewed: ?Allergy & Precautions, H&P , NPO status , Patient's Chart, lab work & pertinent test results, reviewed documented beta blocker date and time  ? ?Airway ?Mallampati: II ? ? ?Neck ROM: full ? ? ? Dental ? ?(+) Teeth Intact ?  ?Pulmonary ?neg pulmonary ROS,  ?  ?Pulmonary exam normal ? ? ? ? ? ? ? Cardiovascular ?Exercise Tolerance: Poor ?hypertension, On Medications ?negative cardio ROS ?Normal cardiovascular exam ?Rhythm:regular Rate:Normal ? ? ?  ?Neuro/Psych ?negative neurological ROS ? negative psych ROS  ? GI/Hepatic ?negative GI ROS, Neg liver ROS,   ?Endo/Other  ?negative endocrine ROS ? Renal/GU ?negative Renal ROS  ?negative genitourinary ?  ?Musculoskeletal ? ? Abdominal ?  ?Peds ? Hematology ?negative hematology ROS ?(+)   ?Anesthesia Other Findings ?Past Medical History: ?No date: Arthritis ?No date: Basal cell carcinoma ?    Comment:  nose, temple, lt.arm ?No date: HTN (hypertension) ?No date: Hyperlipidemia ?No date: Skin cancer ?No date: Spinal stenosis of lumbar region with neurogenic claudication ?Past Surgical History: ?No date: ANKLE FRACTURE SURGERY ?    Comment:  Right ?06/08/2017: ANTERIOR LAT LUMBAR FUSION; Left ?    Comment:  Procedure: LEFT LUMBAR 2- LUMBAR 3, LUMBAR 3- LUMBAR 4,  ?             LUMBAR 4- LUMBAR 5 ANTERIOR LATERAL LUMBAR INTERBODY  ?             FUSION;  Surgeon: Erline Levine, MD;  Location: [place];   ?             Service: Neurosurgery;  Laterality: Left;  LEFT LUMBAR 2- ?             LUMBAR 3, LUMBAR 3- LUMBAR 4, LUMBAR 4- LUMBAR 5 ANTERIOR ?             LATERAL LUMBAR INTERBODY FUSION ?No date: CERVICAL SPINE SURGERY ?11/08/2015: COLONOSCOPY WITH PROPOFOL; N/A ?    Comment:  Procedure: COLONOSCOPY WITH PROPOFOL;  Surgeon: Billie Ruddy ?             Gustavo Lah, MD;  Location: [facility] ENDOSCOPY;  Service:  ?             Endoscopy;  Laterality:  N/A; ?No date: EYE SURGERY ?    Comment:  Bilateral cataracts ?06/08/2017: LUMBAR PERCUTANEOUS PEDICLE SCREW 3 LEVEL; Left ?    Comment:  Procedure: LUMBAR PERCUTANEOUS PEDICLE SCREW 3 LEVEL;   ?             Surgeon: Erline Levine, MD;  Location: [place];  Service:  ?             Neurosurgery;  Laterality: Left;  LUMBAR PERCUTANEOUS  ?             PEDICLE SCREW 3 LEVEL ? ? Reproductive/Obstetrics ?negative OB ROS ? ?  ? ? ? ? ? ? ? ? ? ? ? ? ? ?  ?  ? ? ? ? ? ? ? ? ?Anesthesia Physical ?Anesthesia Plan ? ?ASA: 2 ? ?Anesthesia Plan: General  ? ?Post-op Pain Management:   ? ?Induction:  ? ?PONV Risk Score and Plan:  ? ?Airway Management Planned:  ? ?Additional Equipment:  ? ?Intra-op Plan:  ? ?Post-operative Plan:  ? ?Informed Consent: I have reviewed the patients History and Physical, chart, labs and discussed the procedure including the risks, benefits and  alternatives for the proposed anesthesia with the patient or authorized representative who has indicated his/her understanding and acceptance.  ? ? ? ?Dental Advisory Given ? ?Plan Discussed with: CRNA ? ?Anesthesia Plan Comments:   ? ? ? ? ? ? ?Anesthesia Quick Evaluation ? ?

## 2021-05-12 NOTE — Transfer of Care (Signed)
Immediate Anesthesia Transfer of Care Note ? ?Patient: Thomas Berger ? ?Procedure(s) Performed: COLONOSCOPY WITH PROPOFOL ? ?Patient Location: PACU ? ?Anesthesia Type:General ? ?Level of Consciousness: drowsy ? ?Airway & Oxygen Therapy: Patient Spontanous Breathing ? ?Post-op Assessment: Report given to RN and Post -op Vital signs reviewed and stable ? ?Post vital signs: Reviewed and stable ? ?Last Vitals:  ?Vitals Value Taken Time  ?BP    ?Temp    ?Pulse    ?Resp    ?SpO2    ? ? ?Last Pain:  ?Vitals:  ? 05/12/21 0751  ?TempSrc: Temporal  ?PainSc: 0-No pain  ?   ? ?  ? ?Complications: No notable events documented. ?

## 2021-05-12 NOTE — Anesthesia Procedure Notes (Signed)
Date/Time: 05/12/2021 8:21 AM ?Performed by: Demetrius Charity, CRNA ?Pre-anesthesia Checklist: Patient identified, Emergency Drugs available, Suction available, Patient being monitored and Timeout performed ?Patient Re-evaluated:Patient Re-evaluated prior to induction ?Oxygen Delivery Method: Nasal cannula ?Induction Type: IV induction ?Placement Confirmation: CO2 detector and positive ETCO2 ? ? ? ? ?

## 2021-05-12 NOTE — Anesthesia Postprocedure Evaluation (Signed)
Anesthesia Post Note ? ?Patient: BASHEER MOLCHAN ? ?Procedure(s) Performed: COLONOSCOPY WITH PROPOFOL ? ?Patient location during evaluation: PACU ?Anesthesia Type: General ?Level of consciousness: awake and alert ?Pain management: pain level controlled ?Vital Signs Assessment: post-procedure vital signs reviewed and stable ?Respiratory status: spontaneous breathing, nonlabored ventilation, respiratory function stable and patient connected to nasal cannula oxygen ?Cardiovascular status: blood pressure returned to baseline and stable ?Postop Assessment: no apparent nausea or vomiting ?Anesthetic complications: no ? ? ?No notable events documented. ? ? ?Last Vitals:  ?Vitals:  ? 05/12/21 0901 05/12/21 0912  ?BP: 113/68 117/67  ?Pulse: (!) 58 (!) 57  ?Resp: 13 15  ?Temp:  (!) 35.6 ?C  ?SpO2: 100% 99%  ?  ?Last Pain:  ?Vitals:  ? 05/12/21 0912  ?TempSrc: Temporal  ?PainSc: 0-No pain  ? ? ?  ?  ?  ?  ?  ?  ? ?Molli Barrows ? ? ? ? ?

## 2021-05-12 NOTE — Interval H&P Note (Signed)
History and Physical Interval Note: Preprocedure H&P from 05/12/21 ? was reviewed and there was no interval change after seeing and examining the patient.  Written consent was obtained from the patient after discussion of risks, benefits, and alternatives. Patient has consented to proceed with Colonoscopy with possible intervention ? ? ?05/12/2021 ?8:17 AM ? ?Thomas Berger  has presented today for surgery, with the diagnosis of K52.839  Microscopic colitis, unspecified microscopic colitis type.  The various methods of treatment have been discussed with the patient and family. After consideration of risks, benefits and other options for treatment, the patient has consented to  Procedure(s): ?COLONOSCOPY WITH PROPOFOL (N/A) as a surgical intervention.  [name] history has been reviewed, patient examined, no change in status, stable for surgery.  I have reviewed the patient's chart and labs.  Questions were answered to the patient's satisfaction.   ? ? ?Annamaria Helling ? ? ?

## 2021-05-13 ENCOUNTER — Encounter: Payer: Self-pay | Admitting: Gastroenterology

## 2021-05-13 LAB — SURGICAL PATHOLOGY

## 2021-08-18 ENCOUNTER — Ambulatory Visit (INDEPENDENT_AMBULATORY_CARE_PROVIDER_SITE_OTHER): Payer: Medicare Other | Admitting: Urology

## 2021-08-18 ENCOUNTER — Encounter: Payer: Self-pay | Admitting: Urology

## 2021-08-18 VITALS — BP 161/84 | HR 80 | Ht 68.0 in | Wt 200.0 lb

## 2021-08-18 DIAGNOSIS — N401 Enlarged prostate with lower urinary tract symptoms: Secondary | ICD-10-CM | POA: Diagnosis not present

## 2021-08-18 DIAGNOSIS — Z125 Encounter for screening for malignant neoplasm of prostate: Secondary | ICD-10-CM

## 2021-08-18 LAB — BLADDER SCAN AMB NON-IMAGING: Scan Result: 62

## 2021-08-18 NOTE — Progress Notes (Signed)
08/18/2021 2:33 PM   Thomas Berger 11-03-1945 [id number]  Referring provider: Dion Body, MD [facility] [place],  [place] [address]  Chief Complaint  Patient presents with   Benign Prostatic Hypertrophy    HPI: Thomas Berger is a 76 y.o. male referred for evaluation of BPH.  I had noticed worsening lower urinary tract symptoms over the last few months States he is normally sits to void in the a.m. and felt he could not empty his bladder much better standing.  Some decreased force and caliber of his urinary stream less than 50% of the time and sensation of incomplete emptying ~ 50% of the time.  IPSS 8/35; QoL rated 2/6 No previous urologic evaluation or prior BPH treatments Denies dysuria, gross hematuria No flank, abdominal or pelvic pain Last PSA June 2021 was 0.94    PMH: Past Medical History:  Diagnosis Date   Arthritis    Basal cell carcinoma    nose, temple, lt.arm   HTN (hypertension)    Hyperlipidemia    Skin cancer    Spinal stenosis of lumbar region with neurogenic claudication     Surgical History: Past Surgical History:  Procedure Laterality Date   ANKLE FRACTURE SURGERY     Right   ANTERIOR LAT LUMBAR FUSION Left 06/08/2017   Procedure: LEFT LUMBAR 2- LUMBAR 3, LUMBAR 3- LUMBAR 4, LUMBAR 4- LUMBAR 5 ANTERIOR LATERAL LUMBAR INTERBODY FUSION;  Surgeon: Thomas Levine, MD;  Location: [facility];  Service: Neurosurgery;  Laterality: Left;  LEFT LUMBAR 2- LUMBAR 3, LUMBAR 3- LUMBAR 4, LUMBAR 4- LUMBAR 5 ANTERIOR LATERAL LUMBAR INTERBODY FUSION   CERVICAL SPINE SURGERY     COLONOSCOPY WITH PROPOFOL N/A 11/08/2015   Procedure: COLONOSCOPY WITH PROPOFOL;  Surgeon: Thomas Sails, MD;  Location: [facility] ENDOSCOPY;  Service: Endoscopy;  Laterality: N/A;   COLONOSCOPY WITH PROPOFOL N/A 05/12/2021   Procedure: COLONOSCOPY WITH PROPOFOL;  Surgeon: Thomas Helling, DO;  Location: [facility] ENDOSCOPY;  Service:  Gastroenterology;  Laterality: N/A;   EYE SURGERY     Bilateral cataracts   LUMBAR PERCUTANEOUS PEDICLE SCREW 3 LEVEL Left 06/08/2017   Procedure: LUMBAR PERCUTANEOUS PEDICLE SCREW 3 LEVEL;  Surgeon: Thomas Levine, MD;  Location: [place];  Service: Neurosurgery;  Laterality: Left;  LUMBAR PERCUTANEOUS PEDICLE SCREW 3 LEVEL    Home Medications:  Allergies as of 08/18/2021       Reactions   Penicillins Hives, Other (See Comments)   Has patient had a PCN reaction causing immediate rash, facial/tongue/throat swelling, SOB or lightheadedness with hypotension: No Has patient had a PCN reaction causing SEVERE rash involving mucus membranes or skin necrosis: #  #  #  YES  #  #  #  Has patient had a PCN reaction that required hospitalization: No Has patient had a PCN reaction occurring within the last 10 years: No If all of the above answers are "NO", then may proceed with Cephalosporin use.        Medication List        Accurate as of August 18, 2021  2:33 PM. If you have any questions, ask your nurse or doctor.          STOP taking these medications    BETA CAROTENE PO Stopped by: Thomas Sons, MD   CRANBERRY PLUS VITAMIN C PO Stopped by: Thomas Sons, MD   methocarbamol 500 MG tablet Commonly known as: ROBAXIN Stopped by: Thomas Sons, MD  Oxycodone HCl 10 MG Tabs Stopped by: Thomas Sons, MD       TAKE these medications    acetaminophen 500 MG tablet Commonly known as: TYLENOL Take 500 mg by mouth daily as needed for moderate pain or headache.   aspirin EC 81 MG tablet Take 81 mg by mouth daily.   AYR SALINE NASAL RINSE NA Place 1 Dose into the nose daily as needed (congestion).   diphenhydrAMINE 25 MG tablet Commonly known as: BENADRYL Take 25 mg by mouth at bedtime.   hydrochlorothiazide 12.5 MG tablet Commonly known as: HYDRODIURIL Take 12.5 mg by mouth daily.   losartan 100 MG tablet Commonly known as: COZAAR Take 100 mg by mouth daily.    melatonin 5 MG Tabs Take 5 mg by mouth at bedtime.   sildenafil 50 MG tablet Commonly known as: VIAGRA Take 50 mg by mouth daily as needed for erectile dysfunction.   simvastatin 20 MG tablet Commonly known as: ZOCOR Take 20 mg by mouth at bedtime.        Allergies:  Allergies  Allergen Reactions   Penicillins Hives and Other (See Comments)    Has patient had a PCN reaction causing immediate rash, facial/tongue/throat swelling, SOB or lightheadedness with hypotension: No Has patient had a PCN reaction causing SEVERE rash involving mucus membranes or skin necrosis: #  #  #  YES  #  #  #  Has patient had a PCN reaction that required hospitalization: No Has patient had a PCN reaction occurring within the last 10 years: No If all of the above answers are "NO", then may proceed with Cephalosporin use.     Family History: History reviewed. No pertinent family history.  Social History:  reports that he has never smoked. He quit smokeless tobacco use about 46 years ago.  His smokeless tobacco use included chew. He reports current alcohol use. He reports that he does not use drugs.   Physical Exam: BP (!) 161/84   Pulse 80   Ht '5\' 8"'$  (1.727 m)   Wt 200 lb (90.7 kg)   BMI 30.41 kg/m   Constitutional:  Alert and oriented, No acute distress. HEENT: [place] AT Cardiovascular: No clubbing, cyanosis, or edema. GU: Prostate 35 g, smooth without nodules Skin: No rashes, bruises or suspicious lesions. Neurologic: Grossly intact, no focal deficits, moving all 4 extremities. Psychiatric: Normal mood and affect.    Assessment & Plan:    1. Benign prostatic hyperplasia with LUTS PVR 62 cc Mild-moderate LUTS We discussed medical management of BPH.  Symptoms currently not bothersome enough that he desires to take medication I offered him annual versus prn follow-up.  He indicated he would wait until his next appointment with Thomas Berger to decide  2.  Prostate cancer screening He  inquired about the possibility of prostate cancer.  Last PSA check was in 2021 and was 0.94.  He was informed the chances of prostate cancer are low.  Current prostate cancer screening guidelines were reviewed with PSA/DRE recommended for patients between the ages of 107-69 and healthy patients can be extended to the mid 70s.  He did elect to have his PSA drawn today and will be notified with results   Thomas Berger, [place] [address] [place], [place] [address] [phone]

## 2021-08-19 LAB — URINALYSIS, COMPLETE
Bilirubin, UA: NEGATIVE
Glucose, UA: NEGATIVE
Ketones, UA: NEGATIVE
Leukocytes,UA: NEGATIVE
Nitrite, UA: NEGATIVE
Protein,UA: NEGATIVE
RBC, UA: NEGATIVE
Specific Gravity, UA: 1.025 (ref 1.005–1.030)
Urobilinogen, Ur: 0.2 mg/dL (ref 0.2–1.0)
pH, UA: 5.5 (ref 5.0–7.5)

## 2021-08-19 LAB — MICROSCOPIC EXAMINATION: Bacteria, UA: NONE SEEN

## 2021-08-19 LAB — PSA: Prostate Specific Ag, Serum: 1.2 ng/mL (ref 0.0–4.0)

## 2021-08-22 ENCOUNTER — Telehealth: Payer: Self-pay | Admitting: *Deleted

## 2021-08-22 NOTE — Telephone Encounter (Signed)
Left message on voice mail per DPR .  °

## 2021-08-22 NOTE — Telephone Encounter (Signed)
-----   Message from Abbie Sons, MD sent at 08/21/2021 12:11 PM EDT ----- PSA was normal at 1.2
